# Patient Record
Sex: Female | Born: 1980 | Race: Black or African American | Hispanic: No | Marital: Single | Smoking: Former smoker
Health system: Southern US, Community
[De-identification: ages and names within clinical notes are randomized; demographics above are authoritative.]

## PROBLEM LIST (undated history)

## (undated) DIAGNOSIS — F419 Anxiety disorder, unspecified: Secondary | ICD-10-CM

## (undated) DIAGNOSIS — K219 Gastro-esophageal reflux disease without esophagitis: Secondary | ICD-10-CM

## (undated) DIAGNOSIS — R519 Headache, unspecified: Secondary | ICD-10-CM

## (undated) DIAGNOSIS — R51 Headache: Secondary | ICD-10-CM

## (undated) DIAGNOSIS — R490 Dysphonia: Secondary | ICD-10-CM

## (undated) HISTORY — PX: OTHER SURGICAL HISTORY: SHX169

---

## 2001-03-07 ENCOUNTER — Emergency Department (HOSPITAL_COMMUNITY): Admission: EM | Admit: 2001-03-07 | Discharge: 2001-03-07 | Payer: Self-pay | Admitting: Emergency Medicine

## 2001-03-07 ENCOUNTER — Encounter: Payer: Self-pay | Admitting: Emergency Medicine

## 2001-06-06 ENCOUNTER — Emergency Department (HOSPITAL_COMMUNITY): Admission: EM | Admit: 2001-06-06 | Discharge: 2001-06-06 | Payer: Self-pay

## 2001-12-26 ENCOUNTER — Emergency Department (HOSPITAL_COMMUNITY): Admission: EM | Admit: 2001-12-26 | Discharge: 2001-12-26 | Payer: Self-pay | Admitting: Emergency Medicine

## 2002-03-27 ENCOUNTER — Emergency Department (HOSPITAL_COMMUNITY): Admission: EM | Admit: 2002-03-27 | Discharge: 2002-03-27 | Payer: Self-pay | Admitting: Emergency Medicine

## 2002-03-27 ENCOUNTER — Encounter: Payer: Self-pay | Admitting: Emergency Medicine

## 2002-12-30 ENCOUNTER — Emergency Department (HOSPITAL_COMMUNITY): Admission: EM | Admit: 2002-12-30 | Discharge: 2002-12-31 | Payer: Self-pay | Admitting: Emergency Medicine

## 2003-07-02 ENCOUNTER — Emergency Department (HOSPITAL_COMMUNITY): Admission: EM | Admit: 2003-07-02 | Discharge: 2003-07-02 | Payer: Self-pay | Admitting: Emergency Medicine

## 2003-12-10 ENCOUNTER — Emergency Department (HOSPITAL_COMMUNITY): Admission: EM | Admit: 2003-12-10 | Discharge: 2003-12-10 | Payer: Self-pay | Admitting: Emergency Medicine

## 2004-09-09 ENCOUNTER — Emergency Department (HOSPITAL_COMMUNITY): Admission: EM | Admit: 2004-09-09 | Discharge: 2004-09-09 | Payer: Self-pay | Admitting: Emergency Medicine

## 2005-06-14 ENCOUNTER — Emergency Department (HOSPITAL_COMMUNITY): Admission: EM | Admit: 2005-06-14 | Discharge: 2005-06-14 | Payer: Self-pay | Admitting: Emergency Medicine

## 2006-04-16 ENCOUNTER — Ambulatory Visit (HOSPITAL_COMMUNITY): Admission: RE | Admit: 2006-04-16 | Discharge: 2006-04-16 | Payer: Self-pay | Admitting: Obstetrics & Gynecology

## 2006-09-15 ENCOUNTER — Inpatient Hospital Stay (HOSPITAL_COMMUNITY): Admission: AD | Admit: 2006-09-15 | Discharge: 2006-09-17 | Payer: Self-pay | Admitting: Obstetrics

## 2009-08-16 ENCOUNTER — Encounter: Admission: RE | Admit: 2009-08-16 | Discharge: 2009-08-16 | Payer: Self-pay | Admitting: Family Medicine

## 2010-12-12 LAB — CBC
Hemoglobin: 10.3 — ABNORMAL LOW
MCHC: 33.5
MCHC: 34
MCV: 97.8
Platelets: 235
RBC: 3.1 — ABNORMAL LOW

## 2010-12-12 LAB — RPR: RPR Ser Ql: NONREACTIVE

## 2010-12-12 LAB — CCBB MATERNAL DONOR DRAW

## 2011-01-05 DIAGNOSIS — G44009 Cluster headache syndrome, unspecified, not intractable: Secondary | ICD-10-CM | POA: Insufficient documentation

## 2011-01-10 DIAGNOSIS — E559 Vitamin D deficiency, unspecified: Secondary | ICD-10-CM | POA: Insufficient documentation

## 2012-05-07 DIAGNOSIS — Z87891 Personal history of nicotine dependence: Secondary | ICD-10-CM | POA: Insufficient documentation

## 2013-06-26 DIAGNOSIS — G43909 Migraine, unspecified, not intractable, without status migrainosus: Secondary | ICD-10-CM | POA: Insufficient documentation

## 2014-03-03 DIAGNOSIS — F411 Generalized anxiety disorder: Secondary | ICD-10-CM | POA: Insufficient documentation

## 2014-03-26 ENCOUNTER — Ambulatory Visit: Payer: 59 | Attending: Otolaryngology | Admitting: Speech Pathology

## 2014-03-26 DIAGNOSIS — R490 Dysphonia: Secondary | ICD-10-CM | POA: Diagnosis present

## 2014-03-26 NOTE — Patient Instructions (Addendum)
   Instead of coughing try:  Forceful blow followed by hard swallow  Quiet /MMM/ followed by hard swallow   ABDOMINAL BREATHING  . Place your hand on your belly . Breathe in through your nose and fill your belly with air, watching your hand move outward . Breathe out through your mouth and watch your belly move in. An audible "sh" may help   Think of your belly as a balloon, when you fill with air, the balloon gets bigger. As the air goes out, the balloon deflates.  If you are having difficulty coordinating this, lay on your back with a plastic cup on your belly and repeat the above steps, watching you belly move up with inhalation and down with exhalations  Practice breathing in and out in front of a mirror, watching your belly Breathe in for a count of 5 and breathe out for a count of 5  Now as you breathe out, get a picture of relaxing in your mind Feel the constant in-out of your breathing with your belly Picture the tension in your throat and chest evaporate like steam, melting away and FEEL it do so Picture your throat opening up so wide that a grapefruit or softball could fit through your throat.  There's an App for that: Breathe2relax  Provided by: Vernona RiegerLaura L. ST [phone]

## 2014-03-26 NOTE — Therapy (Signed)
[facility] [address] [place], [place], [address] Phone: [phone]   Fax:  [phone]  Speech Language Pathology Evaluation  Patient Details  Name: Terri Carter MRN: [id number] Date of Birth: [birth year] Referring Provider:  Serena Colonel, MD  Encounter Date: 03/26/2014      End of Session - 03/26/14 1411    SLP Start Time 1227   SLP Stop Time  1315   SLP Time Calculation (min) 48 min   Activity Tolerance Patient tolerated treatment well      No past medical history on file.  No past surgical history on file.  There were no vitals taken for this visit.  Visit Diagnosis: Muscle tension dysphonia - Plan: SLP plan of care cert/re-cert      Subjective Assessment - 03/26/14 1231    Currently in Pain? No/denies   Multiple Pain Sites No          SLP Evaluation [facility] - 03/26/14 1219    SLP Visit Information   SLP Received On 03/26/14   Onset Date February 04, 2014   Medical Diagnosis dysphonia   Subjective   Subjective "all of the sudden I couldn't talk"   Patient/Family Stated Goal Have a normal voice back   General Information   HPI 34 y.o. female who is referred by ENT due muscle tension dysphonia. Intermittent hoarseness began after visiting a family member who has hospitalized with pna. Endoscopic exam of larynx by ENT revealed normal vocal folds with post glottic edema suggestive of LPR. ENT recommend smoking cessation, eliminating caffine, chocolate, spicy and acidic foods. Pt taking omeprazole daily.    Mobility Status walks indpendently   Prior Functional Status   Cognitive/Linguistic Baseline Within functional limits   Type of Home House    Lives With Significant other;Daughter   Vocation On disability   Motor Speech   Overall Motor Speech Impaired   Respiration Impaired   Level of Impairment Conversation   Phonation Hoarse   Phonation Impaired   Vocal Abuses Smoking;Prolonged Vocal  Use;Vocal Fold Dehydration;Habitual Cough/Throat Clear   Tension Present Neck   Volume Appropriate           ADULT SLP TREATMENT - 03/26/14 0001    Cognitive-Linquistic Treatment   Skilled Treatment Pt instructed in voice conservation strategies. She required usual mod A for breathy, confidential voice production. Pt also trained in abdominal breathing in isolation which she achieved with usual min A. Initiated training for cough/throat clear alternative, pt required usual mod A to correctly produce this. Educated re: Interior and spatial designer and hygeine.           SLP Education - 03/26/14 1410    Education provided Yes   Education Details voice conservation/hygiene, abdominal breathing, throat clear alternatives, mechanical, dietary and pharmacologic tx for LPR          SLP Short Term Goals - 03/26/14 1325    SLP SHORT TERM GOAL #1   Title Pt will verbalize vocal conservation/hygeniene strategies wtih 80% accuracy and rare minimal asistance (04/26/14)   Time 4   Period Weeks   Status New   SLP SHORT TERM GOAL #2   Title Pt will report carryover of 4 voice conservation strategies over 3 sessions with modified independence.(04/26/14)   Time 4   Period Weeks   Status New   SLP SHORT TERM GOAL #3   Title Pt will demonsrate abdominal breathing during strucured speech tasks 85% of opportunities with occassional minimal assitance (04/26/14)  Time 4   Period Weeks   Status New          SLP Long Term Goals - 03/26/14 1327    SLP LONG TERM GOAL #1   Title Pt will utlize voice conservation strategies during a 15 minute complex conversation with supervision cues. (05/21/14)   Time 8   Period Weeks   Status New   SLP LONG TERM GOAL #2   Title Pt will utilize abdominal breathing during 15 minute conversation with rare minimal assistance. [phone](04/2414)   Time 8   Period Weeks   Status New   SLP LONG TERM GOAL #3   Title Pt will demonsrate optimal phonation during 15 minute conversation  (05/21/14)   Time 8   Period Weeks   Status New          Plan - 03/26/14 1312    Clinical Impression Statement Pt presents with severe dysphonia with intermittent aphonia. Sustained /a/ average is 2.82 seconds with 15 seconds being WFL. Pt. 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 Tension noted in throat and neck. Pt observed to cough/thorat clear 3x durinng this evaluation. She reports coughing regularly throughout her day. Pt is smoker, planing to quit. Overall intelligiblity is reduced to 75% at face to face conversation level. I reocmmend pt receive skilled ST for voice conservation and vocal hygiene training. Pt has been instructed to limit the use of her voice for only essential communication at this time. Pt to ask employer for duties that limit her talking at this time if available.    Speech Therapy Frequency 2x / week   Treatment/Interventions Compensatory strategies;Functional tasks;Cueing hierarchy;Patient/family education;SLP instruction and feedback;Internal/external aids   Potential to Achieve Goals Good   Consulted and Agree with Plan of Care Patient        Problem List There are no active problems to display for this patient.   Lovvorn, Radene JourneyLaura Ann, SLP 03/26/2014, 2:13 PM  [facility] [address] [place], [place], [address] Phone: [phone]   Fax:  [phone]

## 2014-03-31 ENCOUNTER — Ambulatory Visit: Payer: 59 | Attending: Otolaryngology | Admitting: Speech Pathology

## 2014-03-31 DIAGNOSIS — R490 Dysphonia: Secondary | ICD-10-CM | POA: Diagnosis present

## 2014-03-31 NOTE — Patient Instructions (Signed)
Lynnell GrainYawn-sigh, Continue practicing abdominal breathing

## 2014-03-31 NOTE — Therapy (Signed)
[facility] [address] [place], [place], [address] Phone: [phone]   Fax:  [phone]  Speech Language Pathology Treatment  Patient Details  Name: Terri Carter MRN: [id number] Date of Birth: [birth year] Referring Provider:  Serena Colonel, MD  Encounter Date: 03/31/2014      End of Session - 03/31/14 1016    Visit Number 2   Number of Visits 16   Date for SLP Re-Evaluation 05/21/14   SLP Start Time 0930   SLP Stop Time  1016   SLP Time Calculation (min) 46 min   Activity Tolerance Patient tolerated treatment well      No past medical history on file.  No past surgical history on file.  There were no vitals taken for this visit.  Visit Diagnosis: Muscle tension dysphonia      Subjective Assessment - 03/31/14 0939    Symptoms "I've been practicing my abdominal breathing"   Currently in Pain? Yes   Pain Score 2    Pain Location Throat   Pain Orientation Right   Pain Onset 1 to 4 weeks ago   Pain Frequency Constant   Aggravating Factors  voice   Multiple Pain Sites No             ADULT SLP TREATMENT - 03/31/14 0942    General Information   Behavior/Cognition Alert;Cooperative;Pleasant mood   Treatment Provided   Treatment provided Cognitive-Linquistic   Cognitive-Linquistic Treatment   Treatment focused on Voice   Skilled Treatment Pt doing abdominal breathing in isolation with occassional minimal assistance.  Abdominal breathing in simple conversation helps achieve phonation for 4-5 words before aphonia begins. Sustained /a/ with abdominal breathing averge 6 seconds with occassional min A. Improved from eval last week which was 2.82 seconds. Pt using confidential voice 95% of session with rare minimal assistance. Introduced pitch glides - pt not successful with max A - continued aphonic with pitch breaks. Yawn -sigh with phonation 50% with usual mod A.  Quiet phonation achieved with simple  conversation, and abdominal breathing 50% of utterances.   Assessment / Recommendations / Plan   Plan Continue with current plan of care   Progression Toward Goals   Progression toward goals Progressing toward goals          SLP Education - 03/31/14 1015    Education provided Yes   Education Details abdominal breathing, yawn sigh, confidential voice, limit talking   Person(s) Educated Patient   Methods Explanation;Demonstration   Comprehension Verbalized understanding;Returned demonstration          SLP Short Term Goals - 03/26/14 1325    SLP SHORT TERM GOAL #1   Title Pt will verbalize vocal conservation/hygeniene strategies wtih 80% accuracy and rare minimal asistance (04/26/14)   Time 4   Period Weeks   Status New   SLP SHORT TERM GOAL #2   Title Pt will report carryover of 4 voice conservation strategies over 3 sessions with modified independence.(04/26/14)   Time 4   Period Weeks   Status New   SLP SHORT TERM GOAL #3   Title Pt will demonsrate abdominal breathing during strucured speech tasks 85% of opportunities with occassional minimal assitance (04/26/14)   Time 4   Period Weeks   Status New          SLP Long Term Goals - 03/26/14 1327    SLP LONG TERM GOAL #1   Title Pt will utlize voice conservation strategies during a 15 minute complex  conversation with supervision cues. (05/21/14)   Time 8   Period Weeks   Status New   SLP LONG TERM GOAL #2   Title Pt will utilize abdominal breathing during 15 minute conversation with rare minimal assistance. [phone](04/2414)   Time 8   Period Weeks   Status New   SLP LONG TERM GOAL #3   Title Pt will demonsrate optimal phonation during 15 minute conversation (05/21/14)   Time 8   Period Weeks   Status New          Plan - 03/31/14 1016    Clinical Impression Statement Continue skilled ST to maximize optimal phonation and carryover of vocal conservation and hygiene strategies. Pt brought water bottle to session. Reports  drinking more water throughout the day.   Speech Therapy Frequency 2x / week   Treatment/Interventions Compensatory strategies;Functional tasks;Cueing hierarchy;Patient/family education;SLP instruction and feedback;Internal/external aids   Potential to Achieve Goals Good        Problem List There are no active problems to display for this patient.   Shaaron Golliday, Radene JourneyLaura Ann, SLP 03/31/2014, 10:17 AM  [facility] [address] [place], [place], [address] Phone: [phone]   Fax:  [phone]

## 2014-04-02 ENCOUNTER — Ambulatory Visit: Payer: 59 | Admitting: Speech Pathology

## 2014-04-02 DIAGNOSIS — R490 Dysphonia: Secondary | ICD-10-CM | POA: Diagnosis not present

## 2014-04-02 NOTE — Patient Instructions (Signed)
Pt. To continue yawn/sigh, gentle pitch glides, ha and vocal rest, conservation

## 2014-04-07 ENCOUNTER — Telehealth: Payer: Self-pay | Admitting: Speech Pathology

## 2014-04-07 ENCOUNTER — Ambulatory Visit: Payer: 59 | Admitting: Speech Pathology

## 2014-04-07 NOTE — Telephone Encounter (Signed)
Spoke with pt re: her missed 9:30 speech therapy appointment this morning. Pt reported her daughter is sick and home from school as reason for missed appointment. I reviewed date of her next appointment and instructed her to call and cancel any appointments she is not able to make.

## 2014-04-07 NOTE — Therapy (Signed)
[facility] [address] [place], [place], [address] Phone: [phone]   Fax:  [phone]  Speech Language Pathology Treatment  Patient Details  Name: Terri Carter MRN: [id number] Date of Birth: 1980-08-24 Referring Provider:  Serena Colonelosen, Jefry, MD  Encounter Date: 04/02/2014    No past medical history on file.  No past surgical history on file.  There were no vitals taken for this visit.  Visit Diagnosis: No diagnosis found.               SLP Short Term Goals - 03/26/14 1325    SLP SHORT TERM GOAL #1   Title Pt will verbalize vocal conservation/hygeniene strategies wtih 80% accuracy and rare minimal asistance (04/26/14)   Time 4   Period Weeks   Status New   SLP SHORT TERM GOAL #2   Title Pt will report carryover of 4 voice conservation strategies over 3 sessions with modified independence.(04/26/14)   Time 4   Period Weeks   Status New   SLP SHORT TERM GOAL #3   Title Pt will demonsrate abdominal breathing during strucured speech tasks 85% of opportunities with occassional minimal assitance (04/26/14)   Time 4   Period Weeks   Status New          SLP Long Term Goals - 03/26/14 1327    SLP LONG TERM GOAL #1   Title Pt will utlize voice conservation strategies during a 15 minute complex conversation with supervision cues. (05/21/14)   Time 8   Period Weeks   Status New   SLP LONG TERM GOAL #2   Title Pt will utilize abdominal breathing during 15 minute conversation with rare minimal assistance. [phone](04/2414)   Time 8   Period Weeks   Status New   SLP LONG TERM GOAL #3   Title Pt will demonsrate optimal phonation during 15 minute conversation (05/21/14)   Time 8   Period Weeks   Status New          Problem List There are no active problems to display for this patient.   Vonnetta Akey, Radene JourneyLaura Ann,, SLP 04/07/2014, 8:41 AM  [facility] [address] [place], [place], [address] Phone: [phone]   Fax:  [phone]

## 2014-04-09 ENCOUNTER — Ambulatory Visit: Payer: 59 | Admitting: Speech Pathology

## 2014-04-14 ENCOUNTER — Ambulatory Visit: Payer: 59 | Admitting: Speech Pathology

## 2014-04-14 DIAGNOSIS — R490 Dysphonia: Secondary | ICD-10-CM | POA: Diagnosis not present

## 2014-04-14 NOTE — Therapy (Signed)
[facility] [address] [place], [place], [address] Phone: [phone]   Fax:  [phone]  Speech Language Pathology Treatment  Patient Details  Name: Oval LinseyLatonya M Lobello MRN: [id number] Date of Birth: 04-15-1980 Referring Provider:  Serena Colonelosen, Jefry, MD  Encounter Date: 04/14/2014      End of Session - 04/14/14 1014    SLP Stop Time  1014   Activity Tolerance Patient tolerated treatment well      No past medical history on file.  No past surgical history on file.  There were no vitals taken for this visit.  Visit Diagnosis: Muscle tension dysphonia      Subjective Assessment - 04/14/14 0938    Symptoms "I still have a headache - I haven't slept in 2 days"    Currently in Pain? Yes   Pain Score 9    Pain Location Head   Pain Orientation Right;Left   Pain Descriptors / Indicators Aching   Pain Type Chronic pain   Pain Onset More than a month ago  h/o cluster h/a   Pain Frequency Constant             ADULT SLP TREATMENT - 04/14/14 0944    General Information   Behavior/Cognition Alert;Cooperative;Pleasant mood   Treatment Provided   Treatment provided Cognitive-Linquistic   Cognitive-Linquistic Treatment   Treatment focused on Voice   Skilled Treatment Abdominal breathing with sustained /a/ for 16.75 with clear, quiet phonation.  abdominal breathing in conversation 80% of  conversation . Pitch glides resluted in pitch breaks at high pitch 1/4  glides. Much improved.   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  Conversation with confidentlial voice achieved clear phonation 80% of ocnversation. When pt increased volume, phonation became more hoarse.     Assessment / Recommendations / Plan   Plan Continue with current plan of care   Progression Toward Goals   Progression toward goals Progressing toward goals          SLP Education - 04/14/14 1009    Education provided Yes   Education Details vocal conservation   Person(s) Educated Patient   Methods Explanation;Demonstration   Comprehension Verbalized understanding;Returned demonstration          SLP Short Term Goals - 04/14/14 1012    SLP SHORT TERM GOAL #1   Title Pt will verbalize vocal conservation/hygeniene strategies wtih 80% accuracy and rare minimal asistance (04/26/14)   Time 2   Period Weeks   Status On-going   SLP SHORT TERM GOAL #2   Title Pt will report carryover of 4 voice conservation strategies over 3 sessions with modified independence.(04/26/14)   Time 2   Status On-going   SLP SHORT TERM GOAL #3   Title Pt will demonsrate abdominal breathing during strucured speech tasks 85% of opportunities with occassional minimal assitance (04/26/14)   Time 2   Period Weeks   Status On-going          SLP Long Term Goals - 04/14/14 1013    SLP LONG TERM GOAL #1   Title Pt will utlize voice conservation strategies during a 15 minute complex conversation with supervision cues. (05/21/14)   Time 6   Period Weeks   Status On-going   SLP LONG TERM GOAL #2   Title Pt will utilize abdominal breathing during 15 minute conversation with rare minimal assistance. (04/2414)   Time 6   Status On-going   SLP LONG TERM GOAL #3  Title Pt will demonsrate optimal phonation during 15 minute conversation (05/21/14)   Time 6   Period Weeks   Status On-going          Plan - 04/14/14 1010    Clinical Impression Statement Voice improved today when using confidential voice and sustained /a/. Voice continues to  become hoarse with increased volume.   Speech Therapy Frequency 2x / week   Treatment/Interventions Compensatory strategies;Functional tasks;Cueing hierarchy;Patient/family education;SLP instruction and feedback;Internal/external aids   Potential to Achieve Goals Good   Consulted and Agree with Plan of Care Patient        Problem List There are no active problems to  display for this patient.   Lovvorn, Radene Journey, SLP 04/14/2014, 10:15 AM  [facility] [address] [place], [place], [address] Phone: [phone]   Fax:  [phone]

## 2014-04-14 NOTE — Patient Instructions (Signed)
This week continue focus on breathing, continue confidential voice Practicing breathing and vowels with increased volume

## 2014-04-16 ENCOUNTER — Ambulatory Visit: Payer: 59 | Admitting: Speech Pathology

## 2014-04-16 DIAGNOSIS — R490 Dysphonia: Secondary | ICD-10-CM

## 2014-04-16 NOTE — Patient Instructions (Signed)
  Deep Breathe before each one  Repeat 3-4x slightly increased volume  [place]  [place]  [place], [place], [address], [address], [place], [place], [place], T[place], Tee  F, [place],Th with the above vowels  Can mix sounds if you want   Avoid practicing with k,g Can mix them up

## 2014-04-16 NOTE — Therapy (Signed)
[facility] [address] [place], [place], [address] Phone: [phone]   Fax:  [phone]  Speech Language Pathology Treatment  Patient Details  Name: Terri Carter MRN: [id number] Date of Birth: 12/12/1980 Referring Provider:  Serena Colonel, MD  Encounter Date: 04/16/2014      End of Session - 04/16/14 1153    Visit Number 5   Number of Visits 16   Date for SLP Re-Evaluation 05/21/14   SLP Start Time 1115   SLP Stop Time  1153   SLP Time Calculation (min) 38 min      No past medical history on file.  No past surgical history on file.  There were no vitals taken for this visit.  Visit Diagnosis: Muscle tension dysphonia      Subjective Assessment - 04/16/14 1117    Symptoms Pt. 15 min late to therapy - "I feel much better - my headache is much better"             ADULT SLP TREATMENT - 04/16/14 1119    General Information   Behavior/Cognition Alert;Cooperative;Pleasant mood   Treatment Provided   Treatment provided Cognitive-Linquistic   Pain Assessment   Pain Assessment 0-10   Pain Score 2    Pain Descriptors / Indicators Aching   Pain Intervention(s) Monitored during session   Cognitive-Linquistic Treatment   Treatment focused on Voice   Skilled Treatment Sustained /a/, repetitive vowels and consonant/vowel syllables with abdmonimal breathing and gentle onset voice. She was able to increase volume slightly on these and maintain clear phonation with min to mod A.  Pt utilizing throat clear alternative with rare min A.  Oral reading 5-7 word sentences with confidential voice, abdominal breathing with clear phonation. When instructed to increase volume hoarseness/strained voice returned.      Assessment / Recommendations / Plan   Plan Continue with current plan of care   Progression Toward Goals   Progression toward goals Progressing toward goals          SLP Education - 04/16/14 1152     Education Details added to voice homework   Person(s) Educated Patient   Methods Explanation;Demonstration   Comprehension Verbalized understanding;Returned demonstration          SLP Short Term Goals - 04/16/14 1158    SLP SHORT TERM GOAL #1   Title Pt will verbalize vocal conservation/hygeniene strategies wtih 80% accuracy and rare minimal asistance (04/26/14)   Time 2   Period Weeks   Status Achieved   SLP SHORT TERM GOAL #2   Title Pt will report carryover of 4 voice conservation strategies over 3 sessions with modified independence.(04/26/14)   Time 2   Status Achieved   SLP SHORT TERM GOAL #3   Title Pt will demonsrate abdominal breathing during strucured speech tasks 85% of opportunities with occassional minimal assitance (04/26/14)   Time 2   Period Weeks   Status Achieved          SLP Long Term Goals - 04/16/14 1158    SLP LONG TERM GOAL #1   Title Pt will utlize voice conservation strategies during a 15 minute complex conversation with supervision cues. (05/21/14)   Time 6   Period Weeks   Status On-going   SLP LONG TERM GOAL #2   Title Pt will utilize abdominal breathing during 15 minute conversation with rare minimal assistance. (04/2414)   Time 6   Status On-going   SLP LONG TERM GOAL #3  Title Pt will demonsrate optimal phonation during 15 minute conversation (05/21/14)   Time 6   Period Weeks   Status On-going          Plan - 04/16/14 1155    Clinical Impression Statement Voice continues to be hoarse and strained with increased volume. Pt using conifidiential voice,  abdominal breathing and easy onset phonation at conversation level with occassional min A. Pt successfully increased volume with clear phonation with cv syllable repetition with usual mod A. Continue skilled ST tomaximize optimal phonation.   Speech Therapy Frequency 2x / week   Treatment/Interventions Compensatory strategies;Functional tasks;Cueing hierarchy;Patient/family education;SLP  instruction and feedback;Internal/external aids   Potential to Achieve Goals Good   Consulted and Agree with Plan of Care Patient        Problem List There are no active problems to display for this patient.   Lovvorn, Radene JourneyLaura Ann, SLP 04/16/2014, 11:59 AM  [facility] [address] [place], [place], [address] Phone: [phone]   Fax:  [phone]

## 2014-04-21 ENCOUNTER — Ambulatory Visit: Payer: 59

## 2014-04-21 DIAGNOSIS — R490 Dysphonia: Secondary | ICD-10-CM

## 2014-04-21 NOTE — Therapy (Signed)
[facility] [address] [place], [place], [address] Phone: [phone]   Fax:  [phone]  Speech Language Pathology Treatment  Patient Details  Name: Terri Carter MRN: [id number] Date of Birth: Jul 15, 1980 Referring Provider:  Serena Colonelosen, Jefry, MD  Encounter Date: 04/21/2014      End of Session - 04/21/14 1214    Visit Number 6   Number of Visits 16   Date for SLP Re-Evaluation 05/21/14   SLP Start Time 1022   SLP Stop Time  1101   SLP Time Calculation (min) 39 min   Activity Tolerance Patient limited by pain  pt c/o headache 8/10 and rt throat pain (under mandible) 2-3/10      No past medical history on file.  No past surgical history on file.  There were no vitals taken for this visit.  Visit Diagnosis: Muscle tension dysphonia      Subjective Assessment - 04/21/14 1205    Symptoms Approx 7 minutes late to tx today. Pt reports tightness in throat area.             ADULT SLP TREATMENT - 04/21/14 1029    General Information   Behavior/Cognition Alert;Cooperative;Pleasant mood   Treatment Provided   Treatment provided Cognitive-Linquistic   Pain Assessment   Pain Assessment 0-10   Pain Score 8    Pain Location H/A   Pain Descriptors / Indicators Aching   Pain Intervention(s) Monitored during session   Cognitive-Linquistic Treatment   Treatment focused on Voice   Skilled Treatment Sustained /u/ at static pitch with good to excellent success with WNL confidential voice. SLP assessed pt's confidential voice in 12 minutes conversation as WNL. She used abdominal breathing 90% of the time - pt remarked she had been practicing proper breathing. Pitch glides high to low were successful wiht confidential voice 50% of the time; in low-high-low pitch glides, pt was 25% successful. SLP facilitated progressive relaxation with pt in attempt to foster WNLvoicing in confidential voice with humming - strained  voice predominantly heard. Pt with consistent belching today following instances of 1-2 sips of cool water.    Assessment / Recommendations / Plan   Plan Continue with current plan of care   Progression Toward Goals   Progression toward goals --  limited success with confidential voice via multiple means          SLP Education - 04/21/14 1213    Education provided Yes   Education Details visualization/relaxation   Person(s) Educated Patient   Methods Explanation;Demonstration   Comprehension Verbalized understanding          SLP Short Term Goals - 04/21/14 1216    SLP SHORT TERM GOAL #1   Title Pt will verbalize vocal conservation/hygeniene strategies wtih 80% accuracy and rare minimal asistance (04/26/14)   Time 2   Period Weeks   Status Achieved   SLP SHORT TERM GOAL #2   Title Pt will report carryover of 4 voice conservation strategies over 3 sessions with modified independence.(04/26/14)   Time 2   Status Achieved   SLP SHORT TERM GOAL #3   Title Pt will demonsrate abdominal breathing during strucured speech tasks 85% of opportunities with occassional minimal assitance (04/26/14)   Time 2   Period Weeks   Status Achieved          SLP Long Term Goals - 04/21/14 1217    SLP LONG TERM GOAL #1   Title Pt will utlize voice conservation strategies during  a 15 minute complex conversation with supervision cues. (05/21/14)   Time 5   Period Weeks   Status On-going   SLP LONG TERM GOAL #2   Title Pt will utilize abdominal breathing during 15 minute conversation with rare minimal assistance. (04/2414)   Time 5   Status On-going   SLP LONG TERM GOAL #3   Title Pt will demonsrate optimal phonation during 15 minute conversation (05/21/14)   Time 5   Period Weeks   Status On-going          Plan - 04/21/14 1215    Clinical Impression Statement Pt with limited progress today with confidential voice in every task except in conversation. Cont skilled ST recommended to  maximze functional voice across speaking situations. Assess pt's goals in 1-2 weeks.   Speech Therapy Frequency 2x / week   Duration 2 weeks   Treatment/Interventions Compensatory strategies;Functional tasks;Cueing hierarchy;Patient/family education;SLP instruction and feedback;Internal/external aids   Potential to Achieve Goals Good   Potential Considerations Severity of impairments        Problem List There are no active problems to display for this patient.   [place], SLP 04/21/2014, 12:17 PM  [facility] [address] [place], [place], [address] Phone: [phone]   Fax:  [phone]

## 2014-04-21 NOTE — Patient Instructions (Signed)
Focus your mind on your breathing and visualize clouds/taking pictures and relax the muscles in your body Scan muscles in your throat and picture these muscles relaxing You may want to try some moist heat and/or easy gentle massage of your muscles in your throat.

## 2014-04-23 ENCOUNTER — Ambulatory Visit: Payer: 59 | Admitting: Speech Pathology

## 2014-04-23 DIAGNOSIS — R490 Dysphonia: Secondary | ICD-10-CM | POA: Diagnosis not present

## 2014-04-23 NOTE — Patient Instructions (Signed)
Continue gentle speech exercises with vowels, easy onset, gradually increasing volume as tolerated  Continue relaxation and reduce neck shoulder tension

## 2014-04-23 NOTE — Therapy (Signed)
[facility] [address] [place], [place], [address] Phone: [phone]   Fax:  [phone]  Speech Language Pathology Treatment  Patient Details  Name: Terri Carter MRN: [id number] Date of Birth: May 29, 1980 Referring Provider:  Serena Colonel, MD  Encounter Date: 04/23/2014      End of Session - 04/23/14 1311    Visit Number 7   Number of Visits 16   Date for SLP Re-Evaluation 05/21/14   SLP Start Time 1231   SLP Stop Time  1311   SLP Time Calculation (min) 40 min      No past medical history on file.  No past surgical history on file.  There were no vitals taken for this visit.  Visit Diagnosis: No diagnosis found.      Subjective Assessment - 04/23/14 1232    Symptoms "I just don't feel well"             ADULT SLP TREATMENT - 04/23/14 1233    General Information   Behavior/Cognition Alert;Cooperative;Pleasant mood   Pain Assessment   Pain Assessment 0-10   Pain Score 8    Pain Location head/neck on right   Pain Descriptors / Indicators Aching   Pain Intervention(s) Monitored during session   Cognitive-Linquistic Treatment   Treatment focused on Voice   Skilled Treatment Confidential voice used independently, pt tearful during therapy due to pain and "stress." Confidential sustained vowels and  easy onset cv syllables with clear phonation at confidential voice level, upon increased volume dysphonia returns. Pt with hydrophonia today, despite cues for hard swallows and throat clear (gentle) to clear voice. Pt c/o  pill lodging in her esophagus and food sticking in her sternal area. Pt did not  achieve clear phonation with  increased volume.    Assessment / Recommendations / Plan   Plan Continue with current plan of care   Progression Toward Goals   Progression toward goals Progressing toward goals          SLP Education - 04/23/14 1310    Education provided Yes   Education Details  home exercises for gentle voice   Person(s) Educated Patient   Methods Explanation;Demonstration   Comprehension Verbalized understanding          SLP Short Term Goals - 04/23/14 1312    SLP SHORT TERM GOAL #1   Title Pt will verbalize vocal conservation/hygeniene strategies wtih 80% accuracy and rare minimal asistance (04/26/14)   Time 2   Period Weeks   Status Achieved   SLP SHORT TERM GOAL #2   Title Pt will report carryover of 4 voice conservation strategies over 3 sessions with modified independence.(04/26/14)   Time 2   Status Achieved   SLP SHORT TERM GOAL #3   Title Pt will demonsrate abdominal breathing during strucured speech tasks 85% of opportunities with occassional minimal assitance (04/26/14)   Time 2   Period Weeks   Status Achieved          SLP Long Term Goals - 04/23/14 1312    SLP LONG TERM GOAL #1   Title Pt will utlize voice conservation strategies during a 15 minute complex conversation with supervision cues. (05/21/14)   Time 5   Period Weeks   Status On-going   SLP LONG TERM GOAL #2   Title Pt will utilize abdominal breathing during 15 minute conversation with rare minimal assistance. (04/2414)   Time 5   Status On-going   SLP LONG TERM GOAL #3  Title Pt will demonsrate optimal phonation during 15 minute conversation (05/21/14)   Time 5   Period Weeks   Status On-going          Plan - 04/23/14 1311    Clinical Impression Statement Pt with limited progress today with confidential voice in every task except in conversation. Cont skilled ST recommended to maximze functional voice across speaking situations. Assess pt's goals in 1-2 weeks.   Speech Therapy Frequency 2x / week   Duration 2 weeks   Treatment/Interventions Compensatory strategies;Functional tasks;Cueing hierarchy;Patient/family education;SLP instruction and feedback;Internal/external aids   Potential to Achieve Goals Good   Potential Considerations Severity of impairments    Consulted and Agree with Plan of Care Patient        Problem List There are no active problems to display for this patient.   Adonias Demore, Radene JourneyLaura Ann, SLP 04/23/2014, 1:14 PM  [facility] [address] [place], [place], [address] Phone: [phone]   Fax:  [phone]

## 2014-04-28 ENCOUNTER — Ambulatory Visit: Payer: 59 | Attending: Otolaryngology | Admitting: Speech Pathology

## 2014-04-28 DIAGNOSIS — R490 Dysphonia: Secondary | ICD-10-CM | POA: Diagnosis present

## 2014-04-28 NOTE — Therapy (Signed)
[facility] [address] [place], [place], [address] Phone: [phone]   Fax:  [phone]  Speech Language Pathology Treatment  Patient Details  Name: Terri Carter MRN: [id number] Date of Birth: [birth year] Referring Provider:  Izora Gala, MD  Encounter Date: 04/28/2014      End of Session - 04/28/14 1140    Visit Number 8   Number of Visits 16   Date for SLP Re-Evaluation 05/21/14   SLP Start Time 1112   Activity Tolerance Patient tolerated treatment well      No past medical history on file.  No past surgical history on file.  There were no vitals taken for this visit.  Visit Diagnosis: Muscle tension dysphonia      Subjective Assessment - 04/28/14 1115    Symptoms "I can talk today b/c I've been able to be outside"             ADULT SLP TREATMENT - 04/28/14 1116    General Information   Behavior/Cognition Alert;Cooperative;Pleasant mood   Treatment Provided   Treatment provided Cognitive-Linquistic   Pain Assessment   Pain Assessment 0-10   Pain Score 2    Pain Location right neck   Pain Descriptors / Indicators Aching   Pain Intervention(s) Premedicated before session   Cognitive-Linquistic Treatment   Treatment focused on Voice   Skilled Treatment Pt came in with audible phonation, she reports it helps her voice when she is able to stay cool.  She continues to carryover vocal conservation, throat clear alternative, easy onset phonation, confidential voice outside of therapy. Pitch glides with abdmonimal breathing 80% successful today . Pt is using breathe2relax app outside of therapy. Pt utilized abdmonimal breathing at conversation level with rare minimal assistance. Phonation audible with only mild raspiness. Pt maintained audible, relatively clear phonation over 30 minute conversation.      Assessment / Recommendations / Plan   Plan Continue with current plan of care   Progression  Toward Goals   Progression toward goals Progressing toward goals          SLP Education - 04/28/14 1139    Education provided Yes   Education Details abdominal breathing with "normal voice" and confidential voice   Person(s) Educated Patient   Methods Explanation;Demonstration   Comprehension Verbalized understanding          SLP Short Term Goals - 04/28/14 1152    SLP SHORT TERM GOAL #1   Title Pt will verbalize vocal conservation/hygeniene strategies wtih 80% accuracy and rare minimal asistance (04/26/14)   Time 2   Period Weeks   Status Achieved   SLP SHORT TERM GOAL #2   Title Pt will report carryover of 4 voice conservation strategies over 3 sessions with modified independence.(04/26/14)   Time 2   Status Achieved   SLP SHORT TERM GOAL #3   Title Pt will demonsrate abdominal breathing during strucured speech tasks 85% of opportunities with occassional minimal assitance (04/26/14)   Time 2   Period Weeks   Status Achieved          SLP Long Term Goals - 04/28/14 1152    SLP LONG TERM GOAL #1   Title Pt will utlize voice conservation strategies during a 15 minute complex conversation with supervision cues. (05/21/14)   Time 5   Period Weeks   Status On-going   SLP LONG TERM GOAL #2   Title Pt will utilize abdominal breathing during 15 minute conversation with rare minimal  assistance. (04/2414)   Time 5   Status Achieved   SLP LONG TERM GOAL #3   Title Pt will demonsrate optimal phonation during 15 minute conversation (05/21/14)   Time 5   Period Weeks   Status Partially Met          Plan - 04/28/14 1141    Clinical Impression Statement Pt achieved audible phonation with mild raspiness, however strained, strangled voice has resolved as of yesterday per pt. I instructed pt to monitor voice quatlity and if if voice remains audible , we will consider reducing frequency to 1x a week for 1-2 more weeks.    Speech Therapy Frequency 2x / week   Treatment/Interventions  Compensatory strategies;Functional tasks;Cueing hierarchy;Patient/family education;SLP instruction and feedback;Internal/external aids   Potential to Achieve Goals Good   Consulted and Agree with Plan of Care Patient        Problem List There are no active problems to display for this patient.   [place], Annye Rusk, SLP 04/28/2014, 11:54 AM  [facility] [address] [place], [place], [address] Phone: [phone]   Fax:  [phone]

## 2014-04-28 NOTE — Patient Instructions (Signed)
  Continue to use abdominal breathing with audible "normal" phonation as well as with confidential voice.

## 2014-04-30 ENCOUNTER — Ambulatory Visit: Payer: 59 | Admitting: Speech Pathology

## 2014-05-05 ENCOUNTER — Ambulatory Visit: Payer: 59 | Admitting: Speech Pathology

## 2014-05-05 DIAGNOSIS — R490 Dysphonia: Secondary | ICD-10-CM

## 2014-05-05 NOTE — Therapy (Signed)
[facility] [address] [place], [place], [address] Phone: [phone]   Fax:  [phone]  Speech Language Pathology Treatment  Patient Details  Name: Terri Carter MRN: [id number] Date of Birth: March 05, 1980 Referring Provider:  Izora Gala, MD  Encounter Date: 05/05/2014      End of Session - 05/05/14 1015    SLP Stop Time  1015      No past medical history on file.  No past surgical history on file.  There were no vitals taken for this visit.  Visit Diagnosis: Muscle tension dysphonia      Subjective Assessment - 05/05/14 0944    Symptoms "My voice is gone - I had it 1 week"             ADULT SLP TREATMENT - 05/05/14 0946    General Information   Behavior/Cognition Alert;Cooperative;Pleasant mood   Treatment Provided   Treatment provided Cognitive-Linquistic   Pain Assessment   Pain Assessment No/denies pain   Cognitive-Linquistic Treatment   Treatment focused on Voice   Skilled Treatment Pt reported she had her voice for 1 week but lost it Monday as she said she raised her voice over the phone with her doctor's office. Voice is clear with reduced volume. Abdominal breathing is longer and deeper, allowing pt to  achieve phonation for longer periods. After practicing abdominal breatihng with mod I, pt increased volume with sustained vowels and oral reading 10 word seneteces.  At conversation level, pt achieved clear phonation with audible volume for 20+ words. Pt using abdominal breathing with mod I throughout session. Pt states she "feels reflux coming up" then looses her voice - this occurred 1x during this session.     Assessment / Recommendations / Plan   Plan Continue with current plan of care   Progression Toward Goals   Progression toward goals Progressing toward goals          SLP Education - 05/05/14 1008    Education provided Yes   Education Details continue LPR diet precautions,     Person(s) Educated Patient   Methods Explanation;Demonstration   Comprehension Verbalized understanding          SLP Short Term Goals - 05/05/14 1011    SLP SHORT TERM GOAL #1   Title Pt will verbalize vocal conservation/hygeniene strategies wtih 80% accuracy and rare minimal asistance (04/26/14)   Time 2   Period Weeks   Status Achieved   SLP SHORT TERM GOAL #2   Title Pt will report carryover of 4 voice conservation strategies over 3 sessions with modified independence.(04/26/14)   Time 2   Status Achieved   SLP SHORT TERM GOAL #3   Title Pt will demonsrate abdominal breathing during strucured speech tasks 85% of opportunities with occassional minimal assitance (04/26/14)   Time 2   Period Weeks   Status Achieved          SLP Long Term Goals - 05/05/14 1011    SLP LONG TERM GOAL #1   Title Pt will utlize voice conservation strategies during a 15 minute complex conversation with supervision cues. (05/21/14)   Time 5   Period Weeks   Status On-going   SLP LONG TERM GOAL #2   Title Pt will utilize abdominal breathing during 15 minute conversation with rare minimal assistance. (04/2414)   Time 5   Status Achieved   SLP LONG TERM GOAL #3   Title Pt will demonsrate optimal phonation during 15 minute  conversation (05/21/14)   Time 5   Period Weeks   Status Partially Met          Plan - 05/05/14 1009    Clinical Impression Statement Pt using abdmonimal breathing, voice conservation, LPR tx - with mod I. voice improved for 1 week at this time. recommend reduce ST to 1x a week for carry over of compensations for for dysphonia.    Speech Therapy Frequency 2x / week   Duration 2 weeks   Treatment/Interventions Compensatory strategies;Functional tasks;Cueing hierarchy;Patient/family education;SLP instruction and feedback;Internal/external aids   Potential to Achieve Goals Good        Problem List There are no active problems to display for this patient.   Moo Gravley,  Annye Rusk, SLP 05/05/2014, 10:15 AM  [facility] [address] [place], [place], [address] Phone: [phone]   Fax:  [phone]

## 2014-05-05 NOTE — Patient Instructions (Signed)
Continue abdominal breathing, pitch glides, voice conservation

## 2014-05-07 ENCOUNTER — Ambulatory Visit: Payer: 59

## 2014-05-12 ENCOUNTER — Ambulatory Visit: Payer: 59 | Admitting: Speech Pathology

## 2014-05-14 ENCOUNTER — Encounter: Payer: 59 | Admitting: Speech Pathology

## 2014-05-19 ENCOUNTER — Ambulatory Visit: Payer: 59 | Admitting: Speech Pathology

## 2014-05-21 ENCOUNTER — Encounter: Payer: 59 | Admitting: Speech Pathology

## 2014-05-26 ENCOUNTER — Encounter: Payer: Self-pay | Admitting: Speech Pathology

## 2014-05-26 NOTE — Therapy (Unsigned)
[facility] [address] [place], [place], [address] Phone: [phone]   Fax:  [phone]  Patient Details  Name: MINAL STULLER MRN: [id number] Date of Birth: 1980-05-29 Referring Provider:  Dr. Avel Peace  Encounter Date: 05/26/2014  SPEECH THERAPY DISCHARGE SUMMARY  Visits from Start of Care: 9  Current functional level related to goals / functional outcomes:   SLP LONG TERM GOAL #1     Title  Pt will utlize voice conservation strategies during a 15 minute complex conversation with supervision cues. (05/21/14)    Time  5    Period  Weeks    Status  Achieved    SLP LONG TERM GOAL #2    Title  Pt will utilize abdominal breathing during 15 minute conversation with rare minimal assistance. (04/2414)    Time  5    Status  Achieved    SLP LONG TERM GOAL #3    Title  Pt will demonsrate optimal phonation during 15 minute conversation (05/21/14)    Time  5    Period  Weeks    Status  Achieved            Remaining deficits: Pt reports her voice has improved over 2 weeks. Continue pharmacotheraputic and mechanical treatment of reflux. Continue voice conservation, vocal rest and abdominal breathing/relaxation as needed   Education / Equipment: Voice conservation, relaxation, breathing for speech  Plan: Patient agrees to discharge.  Patient goals were met. Patient is being discharged due to meeting the stated rehab goals.  ?????        Siddiq Kaluzny, Annye Rusk , SLP  05/26/2014, 2:05 PM  [facility] [address] [place], [place], [address] Phone: [phone]   Fax:  [phone]

## 2014-06-18 ENCOUNTER — Encounter (HOSPITAL_COMMUNITY): Payer: Self-pay | Admitting: *Deleted

## 2014-06-22 ENCOUNTER — Other Ambulatory Visit: Payer: Self-pay | Admitting: Gastroenterology

## 2014-06-23 ENCOUNTER — Ambulatory Visit (HOSPITAL_COMMUNITY)
Admission: RE | Admit: 2014-06-23 | Discharge: 2014-06-23 | Disposition: A | Discharge status: Home / Self Care | Payer: 59 | Source: Ambulatory Visit | Attending: Gastroenterology | Admitting: Gastroenterology

## 2014-06-23 ENCOUNTER — Ambulatory Visit (HOSPITAL_COMMUNITY): Payer: 59 | Admitting: Anesthesiology

## 2014-06-23 ENCOUNTER — Encounter (HOSPITAL_COMMUNITY)
Admission: RE | Disposition: A | Discharge status: Home / Self Care | Payer: Self-pay | Source: Ambulatory Visit | Attending: Gastroenterology

## 2014-06-23 ENCOUNTER — Encounter (HOSPITAL_COMMUNITY): Payer: Self-pay | Admitting: *Deleted

## 2014-06-23 DIAGNOSIS — K219 Gastro-esophageal reflux disease without esophagitis: Secondary | ICD-10-CM | POA: Insufficient documentation

## 2014-06-23 DIAGNOSIS — K2960 Other gastritis without bleeding: Secondary | ICD-10-CM | POA: Diagnosis not present

## 2014-06-23 DIAGNOSIS — F17200 Nicotine dependence, unspecified, uncomplicated: Secondary | ICD-10-CM | POA: Diagnosis not present

## 2014-06-23 DIAGNOSIS — R490 Dysphonia: Secondary | ICD-10-CM | POA: Diagnosis present

## 2014-06-23 DIAGNOSIS — R142 Eructation: Secondary | ICD-10-CM | POA: Diagnosis present

## 2014-06-23 HISTORY — DX: Headache: R51

## 2014-06-23 HISTORY — DX: Headache, unspecified: R51.9

## 2014-06-23 HISTORY — DX: Gastro-esophageal reflux disease without esophagitis: K21.9

## 2014-06-23 HISTORY — PX: BRAVO PH STUDY: SHX5421

## 2014-06-23 HISTORY — DX: Anxiety disorder, unspecified: F41.9

## 2014-06-23 HISTORY — PX: ESOPHAGOGASTRODUODENOSCOPY (EGD) WITH PROPOFOL: SHX5813

## 2014-06-23 HISTORY — DX: Dysphonia: R49.0

## 2014-06-23 SURGERY — ESOPHAGOGASTRODUODENOSCOPY (EGD) WITH PROPOFOL
Anesthesia: Monitor Anesthesia Care

## 2014-06-23 MED ORDER — BUTAMBEN-TETRACAINE-BENZOCAINE 2-2-14 % EX AERO: INHALATION_SPRAY | CUTANEOUS | Status: DC | PRN

## 2014-06-23 MED ORDER — SODIUM CHLORIDE 0.9 % IV SOLN: INTRAVENOUS | Status: DC

## 2014-06-23 MED ORDER — LACTATED RINGERS IV SOLN
INTRAVENOUS | Status: DC
  Administered 2014-06-23: 15:00:00 via INTRAVENOUS

## 2014-06-23 MED ORDER — LIDOCAINE HCL (CARDIAC) 20 MG/ML IV SOLN
INTRAVENOUS | Status: DC | PRN
  Administered 2014-06-23: 50 mg via INTRAVENOUS

## 2014-06-23 MED ORDER — LIDOCAINE HCL (CARDIAC) 20 MG/ML IV SOLN
INTRAVENOUS | Status: AC
  Filled 2014-06-23: qty 5

## 2014-06-23 MED ORDER — PROPOFOL 10 MG/ML IV BOLUS
INTRAVENOUS | Status: DC | PRN
  Administered 2014-06-23: 80 mg via INTRAVENOUS
  Administered 2014-06-23: 50 mg via INTRAVENOUS

## 2014-06-23 MED ORDER — PROPOFOL INFUSION 10 MG/ML OPTIME
INTRAVENOUS | Status: DC | PRN
  Administered 2014-06-23: 120 ug/kg/min via INTRAVENOUS

## 2014-06-23 SURGICAL SUPPLY — 14 items

## 2014-06-23 NOTE — Addendum Note (Signed)
Addended by: Savannha Welle on: 06/23/2014 10:50 AM   Modules accepted: Orders  

## 2014-06-23 NOTE — Op Note (Signed)
[facility] [address]  [place], [address]   ENDOSCOPY PROCEDURE REPORT  PATIENT: Terri Carter, Terri Carter  MR#: [id number] BIRTHDATE: May 14, 1980 , 33  yrs. old GENDER: female ENDOSCOPIST: Charlott RakesVincent Macon Lesesne, MD REFERRED BY: PROCEDURE DATE:  06/23/2014 PROCEDURE:  EGD w/ Bravo capsule placement ASA CLASS:     Class II INDICATIONS:  hoarseness; GERD; belching. MEDICATIONS: Monitored anesthesia care and Per Anesthesia TOPICAL ANESTHETIC:  DESCRIPTION OF PROCEDURE: After the risks benefits and alternatives of the procedure were thoroughly explained, informed consent was obtained.  The Pentax Gastroscope [id number] endoscope was introduced through the mouth and advanced to the second portion of the duodenum , Without limitations.  The instrument was slowly withdrawn as the mucosa was fully examined. Estimated blood loss is zero unless otherwise noted in this procedure report.    Esophagus normal and GEJ 40 cm from the incisors. Minimal antral erosions noted consistent with antral gastritis. Stomach otherwise normal in appearance. Duodenal bulb and 2nd portion of the duodenum normal in appearance. Endoscope removed and Bravo capsule catheter inserted and deployed in the usual fashion at 34 cm from the incisors (6 cm above the GEJ). Endoscope reinserted and confirmed successful placement of the Bravo capsule.       Retroflexed views revealed no abnormalities.     The scope was then withdrawn from the patient and the procedure completed.  COMPLICATIONS: There were no immediate complications.  ENDOSCOPIC IMPRESSION:     Minimal antral gastritis S/P Bravo capsule placement  RECOMMENDATIONS:     F/U on Bravo capsule results off of PPIs    eSigned:  Charlott RakesVincent Edilia Ghuman, MD 06/23/2014 3:13 PM    CC:  CPT CODES: ICD CODES:  The ICD and CPT codes recommended by this software are interpretations from the data that the clinical staff has captured with the software.   The verification of the translation of this report to the ICD and CPT codes and modifiers is the sole responsibility of the health care institution and practicing physician where this report was generated.  [employer] will not be held responsible for the validity of the ICD and CPT codes included on this report.  AMA assumes no liability for data contained or not contained herein. CPT is a Publishing rights managerregistered trademark of the [employer].  PATIENT NAME:  Terri Carter, Terri Carter MR#: [id number]

## 2014-06-23 NOTE — Interval H&P Note (Signed)
History and Physical Interval Note:  06/23/2014 2:34 PM  Terri Carter  has presented today for surgery, with the diagnosis of hoarsness/gerd  The various methods of treatment have been discussed with the patient and family. After consideration of risks, benefits and other options for treatment, the patient has consented to  Procedure(s): ESOPHAGOGASTRODUODENOSCOPY (EGD) WITH PROPOFOL (N/A) BRAVO PH STUDY (N/A) as a surgical intervention .  The patient's history has been reviewed, patient examined, no change in status, stable for surgery.  I have reviewed the patient's chart and labs.  Questions were answered to the patient's satisfaction.     Traevon Meiring C.

## 2014-06-23 NOTE — H&P (View-Only) (Signed)
Patient ID: Terri Carter, female   DOB: 1981-02-03, 34 y.o.   MRN: [id number]  Date of Initial H&P: 06/18/14  History reviewed, patient examined, no change in status, stable for surgery.

## 2014-06-23 NOTE — Anesthesia Postprocedure Evaluation (Signed)
  Anesthesia Post-op Note  Patient: Terri Carter  Procedure(s) Performed: Procedure(s): ESOPHAGOGASTRODUODENOSCOPY (EGD) WITH PROPOFOL (N/A) BRAVO PH STUDY (N/A)  Patient Location: PACU  Anesthesia Type: MAC  Level of Consciousness: awake and alert   Airway and Oxygen Therapy: Patient Spontanous Breathing  Post-op Pain: none  Post-op Assessment: Post-op Vital signs reviewed, Patient's Cardiovascular Status Stable and Respiratory Function Stable  Post-op Vital Signs: Reviewed  Filed Vitals:   06/23/14 1511  BP: 116/74  Pulse: 82  Temp: 36.4 C  Resp: 18    Complications: No apparent anesthesia complications

## 2014-06-23 NOTE — Anesthesia Preprocedure Evaluation (Signed)
Anesthesia Evaluation  Patient identified by MRN, date of birth, ID band Patient awake    Reviewed: Allergy & Precautions, NPO status , Patient's Chart, lab work & pertinent test results  Airway Mallampati: II   Neck ROM: full    Dental   Pulmonary Current Smoker,  breath sounds clear to auscultation        Cardiovascular negative cardio ROS  Rhythm:regular Rate:Normal     Neuro/Psych  Headaches, Anxiety    GI/Hepatic GERD-  ,  Endo/Other    Renal/GU      Musculoskeletal   Abdominal   Peds  Hematology   Anesthesia Other Findings   Reproductive/Obstetrics                             Anesthesia Physical Anesthesia Plan  ASA: II  Anesthesia Plan: MAC   Post-op Pain Management:    Induction: Intravenous  Airway Management Planned: Nasal Cannula  Additional Equipment:   Intra-op Plan:   Post-operative Plan:   Informed Consent: I have reviewed the patients History and Physical, chart, labs and discussed the procedure including the risks, benefits and alternatives for the proposed anesthesia with the patient or authorized representative who has indicated his/her understanding and acceptance.     Plan Discussed with: CRNA, Anesthesiologist and Surgeon  Anesthesia Plan Comments:         Anesthesia Quick Evaluation

## 2014-06-23 NOTE — Progress Notes (Signed)
Patient ID: Terri Carter, female   DOB: 05/28/1980, 33 y.o.   MRN: [id number]  Date of Initial H&P: 06/18/14  History reviewed, patient examined, no change in status, stable for surgery. 

## 2014-06-23 NOTE — Discharge Instructions (Signed)
Esophagogastroduodenoscopy °Care After °Refer to this sheet in the next few weeks. These instructions provide you with information on caring for yourself after your procedure. Your caregiver may also give you more specific instructions. Your treatment has been planned according to current medical practices, but problems sometimes occur. Call your caregiver if you have any problems or questions after your procedure.  °HOME CARE INSTRUCTIONS °· Do not eat or drink anything until the numbing medicine (local anesthetic) has worn off and your gag reflex has returned. You will know that the local anesthetic has worn off when you can swallow comfortably. °· Do not drive for 12 hours after the procedure or as directed by your caregiver. °· Only take medicines as directed by your caregiver. °SEEK MEDICAL CARE IF:  °· You cannot stop coughing. °· You are not urinating at all or less than usual. °SEEK IMMEDIATE MEDICAL CARE IF: °· You have difficulty swallowing. °· You cannot eat or drink. °· You have worsening throat or chest pain. °· You have dizziness, lightheadedness, or you faint. °· You have nausea or vomiting. °· You have chills. °· You have a fever. °· You have severe abdominal pain. °· You have black, tarry, or bloody stools. °Document Released: 01/31/2012 Document Reviewed: 01/31/2012 °Exit[employer]® Patient Information ©2015 [employer]. This information is not intended to replace advice given to you by your health care provider. Make sure you discuss any questions you have with your health care provider. ° °

## 2014-06-23 NOTE — Transfer of Care (Signed)
Immediate Anesthesia Transfer of Care Note  Patient: Terri Carter  Procedure(s) Performed: Procedure(s): ESOPHAGOGASTRODUODENOSCOPY (EGD) WITH PROPOFOL (N/A) BRAVO PH STUDY (N/A)  Patient Location: PACU  Anesthesia Type:MAC  Level of Consciousness: awake, alert  and oriented  Airway & Oxygen Therapy: Patient Spontanous Breathing and Patient connected to nasal cannula oxygen  Post-op Assessment: Report given to RN and Post -op Vital signs reviewed and stable  Post vital signs: Reviewed and stable  Last Vitals:  Filed Vitals:   06/23/14 1511  BP: 116/74  Pulse: 82  Temp: 36.4 C  Resp: 18    Complications: No apparent anesthesia complications

## 2014-06-24 ENCOUNTER — Encounter (HOSPITAL_COMMUNITY): Payer: Self-pay | Admitting: Gastroenterology

## 2015-09-20 ENCOUNTER — Ambulatory Visit (INDEPENDENT_AMBULATORY_CARE_PROVIDER_SITE_OTHER): Payer: 59 | Admitting: Licensed Clinical Social Worker

## 2015-09-20 DIAGNOSIS — F411 Generalized anxiety disorder: Secondary | ICD-10-CM

## 2015-09-27 ENCOUNTER — Ambulatory Visit: Payer: 59 | Admitting: Licensed Clinical Social Worker

## 2016-05-03 ENCOUNTER — Other Ambulatory Visit: Payer: Self-pay | Admitting: Specialist

## 2016-05-03 DIAGNOSIS — G4482 Headache associated with sexual activity: Secondary | ICD-10-CM

## 2018-04-30 HISTORY — PX: CERVICAL CONE BIOPSY: SUR198

## 2019-03-20 HISTORY — PX: XI ROBOTIC ASSISTED TOTAL HYSTERECTOMY WITH SALPINGECTOMY: SHX6835

## 2019-04-01 DIAGNOSIS — Z90710 Acquired absence of both cervix and uterus: Secondary | ICD-10-CM | POA: Insufficient documentation

## 2019-05-19 DIAGNOSIS — F330 Major depressive disorder, recurrent, mild: Secondary | ICD-10-CM | POA: Insufficient documentation

## 2019-07-17 ENCOUNTER — Emergency Department (HOSPITAL_COMMUNITY)
Admission: EM | Admit: 2019-07-17 | Discharge: 2019-07-17 | Disposition: A | Discharge status: Home / Self Care | Payer: 59 | Attending: Emergency Medicine | Admitting: Emergency Medicine

## 2019-07-17 ENCOUNTER — Emergency Department (HOSPITAL_COMMUNITY): Payer: 59

## 2019-07-17 ENCOUNTER — Encounter (HOSPITAL_COMMUNITY): Payer: Self-pay

## 2019-07-17 DIAGNOSIS — R102 Pelvic and perineal pain: Secondary | ICD-10-CM | POA: Diagnosis not present

## 2019-07-17 DIAGNOSIS — Z79899 Other long term (current) drug therapy: Secondary | ICD-10-CM | POA: Diagnosis not present

## 2019-07-17 DIAGNOSIS — N83201 Unspecified ovarian cyst, right side: Secondary | ICD-10-CM | POA: Diagnosis not present

## 2019-07-17 DIAGNOSIS — F17210 Nicotine dependence, cigarettes, uncomplicated: Secondary | ICD-10-CM | POA: Diagnosis not present

## 2019-07-17 DIAGNOSIS — R1031 Right lower quadrant pain: Secondary | ICD-10-CM | POA: Diagnosis present

## 2019-07-17 DIAGNOSIS — N9489 Other specified conditions associated with female genital organs and menstrual cycle: Secondary | ICD-10-CM

## 2019-07-17 LAB — COMPREHENSIVE METABOLIC PANEL
ALT: 11 U/L (ref 0–44)
AST: 13 U/L — ABNORMAL LOW (ref 15–41)
Albumin: 4.4 g/dL (ref 3.5–5.0)
Alkaline Phosphatase: 81 U/L (ref 38–126)
Anion gap: 13 (ref 5–15)
BUN: 10 mg/dL (ref 6–20)
CO2: 25 mmol/L (ref 22–32)
Calcium: 9.3 mg/dL (ref 8.9–10.3)
Chloride: 98 mmol/L (ref 98–111)
Creatinine, Ser: 0.82 mg/dL (ref 0.44–1.00)
GFR calc Af Amer: >60 mL/min (ref >60)
GFR calc non Af Amer: >60 mL/min (ref >60)
Glucose, Bld: 103 mg/dL — ABNORMAL HIGH (ref 70–99)
Potassium: 3.6 mmol/L (ref 3.5–5.1)
Sodium: 136 mmol/L (ref 135–145)
Total Bilirubin: 1.1 mg/dL (ref 0.3–1.2)
Total Protein: 7.1 g/dL (ref 6.5–8.1)

## 2019-07-17 LAB — CBC
HCT: 37.9 % (ref 36.0–46.0)
Hemoglobin: 12.4 g/dL (ref 12.0–15.0)
MCH: 30.5 pg (ref 26.0–34.0)
MCHC: 32.7 g/dL (ref 30.0–36.0)
MCV: 93.3 fL (ref 80.0–100.0)
Platelets: 370 10*3/uL (ref 150–400)
RBC: 4.06 MIL/uL (ref 3.87–5.11)
RDW: 12.4 % (ref 11.5–15.5)
WBC: 13 10*3/uL — ABNORMAL HIGH (ref 4.0–10.5)
nRBC: 0 % (ref 0.0–0.2)

## 2019-07-17 LAB — LIPASE, BLOOD: Lipase: 18 U/L (ref 11–51)

## 2019-07-17 LAB — WET PREP, GENITAL
Clue Cells Wet Prep HPF POC: NONE SEEN
Sperm: NONE SEEN
Trich, Wet Prep: NONE SEEN
Yeast Wet Prep HPF POC: NONE SEEN

## 2019-07-17 LAB — I-STAT BETA HCG BLOOD, ED (MC, WL, AP ONLY): I-stat hCG, quantitative: <5 m[IU]/mL (ref <5)

## 2019-07-17 MED ORDER — ONDANSETRON 4 MG PO TBDP: 4.0000 mg | ORAL_TABLET | Freq: Three times a day (TID) | ORAL | 0 refills | Status: AC | PRN

## 2019-07-17 MED ORDER — MORPHINE SULFATE (PF) 4 MG/ML IV SOLN
4.0000 mg | Freq: Once | INTRAVENOUS | Status: AC
  Administered 2019-07-17: 4 mg via INTRAVENOUS
  Filled 2019-07-17: qty 1

## 2019-07-17 MED ORDER — MORPHINE SULFATE (PF) 4 MG/ML IV SOLN
4.0000 mg | Freq: Once | INTRAVENOUS | Status: AC
  Administered 2019-07-17: 4 mg via INTRAVENOUS
  Filled 2019-07-17 (×2): qty 1

## 2019-07-17 MED ORDER — SODIUM CHLORIDE 0.9 % IV BOLUS
1000.0000 mL | Freq: Once | INTRAVENOUS | Status: AC
  Administered 2019-07-17: 1000 mL via INTRAVENOUS

## 2019-07-17 MED ORDER — SODIUM CHLORIDE 0.9% FLUSH
3.0000 mL | Freq: Once | INTRAVENOUS | Status: AC
  Administered 2019-07-17: 3 mL via INTRAVENOUS

## 2019-07-17 MED ORDER — ONDANSETRON 4 MG PO TBDP
4.0000 mg | ORAL_TABLET | Freq: Once | ORAL | Status: AC | PRN
  Administered 2019-07-17: 4 mg via ORAL
  Filled 2019-07-17: qty 1

## 2019-07-17 MED ORDER — HYDROMORPHONE HCL 1 MG/ML IJ SOLN
0.5000 mg | Freq: Once | INTRAMUSCULAR | Status: AC
  Administered 2019-07-17: 0.5 mg via INTRAVENOUS
  Filled 2019-07-17: qty 1

## 2019-07-17 MED ORDER — OXYCODONE-ACETAMINOPHEN 5-325 MG PO TABS: 1.0000 | ORAL_TABLET | Freq: Four times a day (QID) | ORAL | 0 refills | Status: AC | PRN

## 2019-07-17 MED ORDER — IOHEXOL 350 MG/ML SOLN
100.0000 mL | Freq: Once | INTRAVENOUS | Status: AC | PRN
  Administered 2019-07-17: 100 mL via INTRAVENOUS

## 2019-07-17 NOTE — ED Notes (Signed)
Patient verbalizes understanding of discharge instructions. Opportunity for questioning and answers were provided. Armband removed by staff, pt discharged from ED stable & ambulatory  

## 2019-07-17 NOTE — ED Provider Notes (Signed)
Accepted handoff at shift change from [facility]. Please see prior provider note for more detail.   Briefly: Patient is 39 y.o.   DDX: concern for appendicitis VS ovarian pathology  Plan: Follow-up on CT scan and disposition appropriately.    Physical Exam  BP 111/65   Pulse 72   Temp 98.5 F (36.9 C) (Oral)   Resp 17   Ht 5\' 7"  (1.702 m)   Wt 68 kg   SpO2 99%   BMI 23.49 kg/m   CONSTITUTIONAL:  well-appearing, NAD NEURO:  Alert and oriented x 3, no focal deficits EYES:  pupils equal and reactive ENT/NECK:  trachea midline, no JVD CARDIO:  reg rate, reg rhythm, well-perfused PULM:  None labored breathing GI/GU:  Abdomen non-distended, significant tenderness in the right lower quadrant with voluntary guarding, no involuntary guarding, no rebound. Pelvic exam with right adnexal tenderness to palpation.  Cervix does not appear to be present although may be retroverted.  No significant vaginal discharge of abnormal appearance.  No blood in the vaginal vault. MSK/SPINE:  No gross deformities, no edema SKIN:  no rash obvious, atraumatic, no ecchymosis  PSYCH:  Appropriate speech and behavior   ED Course/Procedures     Procedures  Results for orders placed or performed during the hospital encounter of 07/17/19  Lipase, blood  Result Value Ref Range   Lipase 18 11 - 51 U/L  Comprehensive metabolic panel  Result Value Ref Range   Sodium 136 135 - 145 mmol/L   Potassium 3.6 3.5 - 5.1 mmol/L   Chloride 98 98 - 111 mmol/L   CO2 25 22 - 32 mmol/L   Glucose, Bld 103 (H) 70 - 99 mg/dL   BUN 10 6 - 20 mg/dL   Creatinine, Ser 0.82 0.44 - 1.00 mg/dL   Calcium 9.3 8.9 - 10.3 mg/dL   Total Protein 7.1 6.5 - 8.1 g/dL   Albumin 4.4 3.5 - 5.0 g/dL   AST 13 (L) 15 - 41 U/L   ALT 11 0 - 44 U/L   Alkaline Phosphatase 81 38 - 126 U/L   Total Bilirubin 1.1 0.3 - 1.2 mg/dL   GFR calc non Af Amer >60 >60 mL/min   GFR calc Af Amer >60 >60 mL/min   Anion gap 13 5 - 15  CBC  Result  Value Ref Range   WBC 13.0 (H) 4.0 - 10.5 K/uL   RBC 4.06 3.87 - 5.11 MIL/uL   Hemoglobin 12.4 12.0 - 15.0 g/dL   HCT 37.9 36.0 - 46.0 %   MCV 93.3 80.0 - 100.0 fL   MCH 30.5 26.0 - 34.0 pg   MCHC 32.7 30.0 - 36.0 g/dL   RDW 12.4 11.5 - 15.5 %   Platelets 370 150 - 400 K/uL   nRBC 0.0 0.0 - 0.2 %  I-Stat beta hCG blood, ED  Result Value Ref Range   I-stat hCG, quantitative <5.0 <5 mIU/mL   Comment 3           CT ABDOMEN PELVIS W CONTRAST  Result Date: 07/17/2019 CLINICAL DATA:  Acute right lower quadrant abdominal pain. EXAM: CT ABDOMEN AND PELVIS WITH CONTRAST TECHNIQUE: Multidetector CT imaging of the abdomen and pelvis was performed using the standard protocol following bolus administration of intravenous contrast. CONTRAST:  137mL OMNIPAQUE IOHEXOL 350 MG/ML SOLN COMPARISON:  None. FINDINGS: Lower chest: No acute abnormality. Hepatobiliary: No focal liver abnormality is seen. No gallstones, gallbladder wall thickening, or biliary dilatation. Pancreas: Unremarkable. No pancreatic ductal  dilatation or surrounding inflammatory changes. Spleen: Normal in size without focal abnormality. Adrenals/Urinary Tract: Adrenal glands appear normal. Small nonobstructive left renal calculus is noted. No hydronephrosis or renal obstruction is noted. Urinary bladder is unremarkable. Stomach/Bowel: Stomach is within normal limits. Appendix appears normal. No evidence of bowel wall thickening, distention, or inflammatory changes. Vascular/Lymphatic: Aortic atherosclerosis. No enlarged abdominal or pelvic lymph nodes. Reproductive: The uterus is not clearly visualized. 7.7 x 3.4 cm complex oval-shaped abnormality is seen in the right adnexal region concerning for complex right ovarian or adnexal mass. Left ovary is unremarkable. Other: No hernia is noted. However, minimal amount of free fluid is noted around the liver and spleen with a mild amount of fluid seen in the pelvis. Musculoskeletal: No acute or  significant osseous findings. IMPRESSION: 1. 7.7 x 3.4 cm complex oval-shaped abnormality seen in the right adnexal region concerning for complex right ovarian or adnexal mass. Pelvic ultrasound is recommended for further evaluation. 2. Small nonobstructive left renal calculus. No hydronephrosis or renal obstruction is noted. 3. Mild amount of free fluid is noted in the pelvis, with minimal fluid noted around the spleen and liver. Aortic Atherosclerosis (ICD10-I70.0). Electronically Signed   By: Lupita Raider M.D.   On: 07/17/2019 16:35    MDM    5:46 PM --discussed with OB/GYN Dr. Macon Large who recommends following up on pelvic ultrasound.    Discussed with Dr. Macon Large results of pelvic ultrasound. As there is blood flow and patient has no uterus there is no emergent need for detorsion. Low suspicion for torsion overall given her symptoms and that the chronicity has been ongoing for 2 days. Patient will be provided with pain medication in the form of Percocet, Tylenol, ibuprofen and will follow up for gynecologic doctor --patient states that she believes she'll be will see her doctor tomorrow or early next week.    I discussed this case with my attending physician who cosigned this note including patient's presenting symptoms, physical exam, and planned diagnostics and interventions. Attending physician stated agreement with plan or made changes to plan which were implemented.   The medical records were personally reviewed by myself. I personally reviewed all lab results and interpreted all imaging studies and either concurred with their official read or contacted radiology for clarification. Additional history obtained from old records/EMS/family members.  This patient appears reasonably screened and I doubt any other medical condition requiring further workup, evaluation, or treatment in the ED at this time prior to discharge.   Patient's vitals are WNL apart from vital sign abnormalities  discussed above, patient is in NAD, and able to ambulate in the ED at their baseline and able to tolerate PO.  Pain has been managed or a plan has been made for home management and has no complaints prior to discharge. Patient is comfortable with above plan and for discharge at this time. All questions were answered prior to disposition. Results from the ER workup discussed with the patient face to face and all questions answered to the best of my ability. The patient is safe for discharge with strict return precautions. Patient appears safe for discharge with appropriate follow-up. Conveyed my impression with the patient and they voiced understanding and are agreeable to plan.   An After Visit Summary was printed and given to the patient.  Portions of this note were generated with Scientist, clinical (histocompatibility and immunogenetics). Dictation errors may occur despite best attempts at proofreading.      Solon Augusta [place], [place] 07/17/19 2047  Rolan Bucco, MD 07/17/19 [phone]

## 2019-07-17 NOTE — Discharge Instructions (Addendum)
Your ultrasound was reassuring and that there is blood flow to your right ovary. On CT scan your appendix was normal. I'm providing you with pain medication to use if ibuprofen and Tylenol are not sufficient. I do recommend that you take Tylenol ibuprofen regularly as directed below however you may use the Percocet if your pain is not controlled.  OB/GYN/gynecologic doctor. I also prescribed you Zofran as needed for nausea.

## 2019-07-17 NOTE — ED Triage Notes (Signed)
Pt arrives to ED w/ RLQ abdominal pain 9/10 pain that started on Tuesday. Pt endorses n/v. Pt states she has not had BM since Tuesday. Pt denies fever.

## 2019-07-17 NOTE — Consult Note (Signed)
GYNECOLOGY ATTENDING TELEPHONE CONSULT NOTE  Consult Date: 07/17/2019  Reason for Consult: Right ovarian enlargement and pain concerning for torsion, s/p hysterectomy in 02/2019 Consulting Provider: Solon Augusta, PA-C in [facility]  History of Present Illness: Terri Carter is an 39 y.o. female with history of robotic assisted total hysterectomy and bilateral salpingectomy in 02/2019, presented to [facility] ER today with two day history of right lower abdominal pain.  Pain was severe and intermittent in nature, associated with nausea and vomiting. After one day, the nausea and vomiting stopped, but the pain got worse and is now sharp and constant. No fevers but she did report chills. No bleeding or vaginal discharge.  Examination by ED provider revealed RLQ tenderness but no peritoneal signs, soft abdomen.  WBC 13.0 and CT scan showed 7.7 x 3.4 cm complex oval-shaped abnormality seen in the right adnexal region concerning for complex right ovarian or adnexal mass.  The concern was about torsion, and ultrasound was ordered. Ultrasound showed : Enlarged heterogeneous echogenic right ovary measuring 8.2 x 5.6 x 8.3 cm. Blood flow is noted with demonstration of both arterial and venous waveforms. There is adjacent free fluid in the right adnexa and pelvic cul-de-sac. No torsion at this time.   Of note, patient is followed by a GYN Provider at [facility] who was the surgeon that performed her hysterectomy (done for cervical dysplasia, no residual dysplasia seen in pathology specimen).  Imaging: CT ABDOMEN PELVIS W CONTRAST  Result Date: 07/17/2019 CLINICAL DATA:  Acute right lower quadrant abdominal pain. EXAM: CT ABDOMEN AND PELVIS WITH CONTRAST TECHNIQUE: Multidetector CT imaging of the abdomen and pelvis was performed using the standard protocol following bolus administration of intravenous contrast. CONTRAST:  OMNIPAQUE IOHEXOL 350 MG/ML SOLN COMPARISON:  None. FINDINGS: Lower chest:  No acute abnormality. Hepatobiliary: No focal liver abnormality is seen. No gallstones, gallbladder wall thickening, or biliary dilatation. Pancreas: Unremarkable. No pancreatic ductal dilatation or surrounding inflammatory changes. Spleen: Normal in size without focal abnormality. Adrenals/Urinary Tract: Adrenal glands appear normal. Small nonobstructive left renal calculus is noted. No hydronephrosis or renal obstruction is noted. Urinary bladder is unremarkable. Stomach/Bowel: Stomach is within normal limits. Appendix appears normal. No evidence of bowel wall thickening, distention, or inflammatory changes. Vascular/Lymphatic: Aortic atherosclerosis. No enlarged abdominal or pelvic lymph nodes. Reproductive: The uterus is not clearly visualized. 7.7 x 3.4 cm complex oval-shaped abnormality is seen in the right adnexal region concerning for complex right ovarian or adnexal mass. Left ovary is unremarkable. Other: No hernia is noted. However, minimal amount of free fluid is noted around the liver and spleen with a mild amount of fluid seen in the pelvis. Musculoskeletal: No acute or significant osseous findings. IMPRESSION: 1. 7.7 x 3.4 cm complex oval-shaped abnormality seen in the right adnexal region concerning for complex right ovarian or adnexal mass. Pelvic ultrasound is recommended for further evaluation. 2. Small nonobstructive left renal calculus. No hydronephrosis or renal obstruction is noted. 3. Mild amount of free fluid is noted in the pelvis, with minimal fluid noted around the spleen and liver. Aortic Atherosclerosis (ICD10-I70.0). Electronically Signed   By: Lupita Raider M.D.   On: 07/17/2019 16:35   US PELVIC COMPLETE W TRANSVAGINAL AND TORSION R/O  Result Date: 07/17/2019 CLINICAL DATA:  Right lower quadrant abdominal pain. Adnexal mass on CT. EXAM: TRANSABDOMINAL AND TRANSVAGINAL ULTRASOUND OF PELVIS DOPPLER ULTRASOUND OF OVARIES TECHNIQUE: Both transabdominal and transvaginal ultrasound  examinations of the pelvis were performed. Transabdominal technique was  performed for global imaging of the pelvis including uterus, ovaries, adnexal regions, and pelvic cul-de-sac. It was necessary to proceed with endovaginal exam following the transabdominal exam to visualize the ovaries and adnexa. Color and duplex Doppler ultrasound was utilized to evaluate blood flow to the ovaries. COMPARISON:  CT earlier this day FINDINGS: Uterus Surgically absent. Right ovary Measurements: 8.2 x 5.6 x 8.3 cm = volume: 200 mL. The ovary is enlarged and heterogeneously echogenic slightly bilobed in shape. Few peripheral follicles are seen. Blood flow is noted with both ovarian and venous flow. Left ovary Measurements: 4.0 x 2.5 x 2.7 cm = volume: 13.9 mL. Follicular cyst measures 2.0 cm. Additional physiologic follicles. Normal blood flow is seen. Pulsed Doppler evaluation of both ovaries demonstrates normal low-resistance arterial and venous waveforms. Other findings Small to moderate free fluid is seen in the right adnexa and pelvic cul-de-sac. IMPRESSION: 1. Enlarged heterogeneous echogenic right ovary measuring 8.2 x 5.6 x 8.3 cm. Blood flow is noted with demonstration of both arterial and venous waveforms. There is adjacent free fluid in the right adnexa and pelvic cul-de-sac. Milder is no torsion at this time, the possibility of intermittent torsion is raised given the ovarian size and adjacent fluid. 2. Recommend gyn consultation. There is no cystic mass in the ovary, and no discrete solid mass other than heterogeneous ovarian enlargement. If further imaging characterization is desired, recommend pelvic MRI. Given patient's relatively recent hysterectomy 4 months ago, correlation with operative report for comment on the right ovary may be helpful. 3. Physiologic follicular cyst in the left ovary with normal blood flow. Electronically Signed   By: Keith Rake M.D.   On: 07/17/2019 19:14     Recommendations: Ultrasound findings rule out current torsion. However, given her pain and enlarged right ovary, outpatient follow up with her gynecologist is recommended.  She may need pelvic MRI for further characterization of this mass, but this can be done outpatient. Patient can be given oral analgesia as needed to control her pain. No need for urgent surgical intervention at this time. This plan was communicated to the provider.   Thank you for this telephone consult.  If additional recommendations are needed, please call [phone] [facility] OB/GYN Consult Attending Monday-Friday 8am - 5pm) or [phone] [facility] OB/GYN Attending On Call all day, every day).    I spent approximately 5 minutes directly consulting with the provider and verbally discussing this case. Additional 20 minutes was spent performing chart review and documentation and reviewing the imaging.     Verita Schneiders, MD, [place] for [employer], [place]

## 2019-07-17 NOTE — ED Notes (Signed)
US

## 2019-07-17 NOTE — ED Provider Notes (Signed)
[facility] EMERGENCY DEPARTMENT Provider Note   CSN: [id number] Arrival date & time: 07/17/19  1059   History Chief Complaint  Patient presents with  . Abdominal Pain    Terri Carter is a 39 y.o. female with history of depression, cervical dysplasia s/p hysterectomy who presents with abdominal pain. Patient states that she developed right lower quadrant abdominal pain around noon 2 days ago. She went to have a bowel movement and then started to have multiple episodes of nausea and vomiting throughout the afternoon and night. She stopped vomiting yesterday but the pain in the right lower quadrant has been gradually worsening. It feels like a sharp cramp. It is constant and radiates to the back. She reports associated chills. She has not been eating but has been drinking fluids and urinating normally. LBM was 2 days ago and she feels the need to have a BM but can't. She denies fever, chest pain, SOB, vaginal discharge. She denies any other abdominal surgeries.  HPI   Past Medical History:  Diagnosis Date  . Anxiety   . GERD (gastroesophageal reflux disease)   . Headache    cluster  . Voice hoarseness last several months    Patient Active Problem List   Diagnosis Date Noted  . Hoarseness 06/23/2014  . GERD (gastroesophageal reflux disease) 06/23/2014    Past Surgical History:  Procedure Laterality Date  . BRAVO PH STUDY N/A 06/23/2014   Procedure: BRAVO PH STUDY;  Surgeon: Charlott Rakes, MD;  Location: [facility];  Service: Endoscopy;  Laterality: N/A;  . ESOPHAGOGASTRODUODENOSCOPY (EGD) WITH PROPOFOL N/A 06/23/2014   Procedure: ESOPHAGOGASTRODUODENOSCOPY (EGD) WITH PROPOFOL;  Surgeon: Charlott Rakes, MD;  Location: [facility];  Service: Endoscopy;  Laterality: N/A;  . wistom teeth extraction  8 years ago     OB History   No obstetric history on file.     No family history on file.  Social History   Tobacco Use  . Smoking status:  Current Every Day Smoker    Types: Cigars  . Smokeless tobacco: Never Used  . Tobacco comment: ciagarettes quit 2010 smoked one half to 1 ppd for 8 years now amokes cigars   Substance Use Topics  . Alcohol use: Yes    Comment: occasional wine  . Drug use: Yes    Types: Marijuana    Comment: occasional marijuana use    Home Medications Prior to Admission medications   Medication Sig Start Date End Date Taking? Authorizing Provider  buPROPion (WELLBUTRIN XL) 300 MG 24 hr tablet Take 300 mg by mouth at bedtime.     [provider]  ibuprofen (ADVIL,MOTRIN) 200 MG tablet Take 400 mg by mouth every 6 (six) hours as needed for moderate pain.     [provider]  SUMAtriptan (IMITREX) 20 MG/ACT nasal spray Place 20 mg into the nose every 2 (two) hours as needed for migraine or headache. May repeat in 2 hours if headache persists or recurs.    [provider]    Allergies    Patient has no known allergies.  Review of Systems   Review of Systems  Constitutional: Positive for appetite change and chills. Negative for fever.  Respiratory: Negative for shortness of breath.   Cardiovascular: Negative for chest pain.  Gastrointestinal: Positive for abdominal pain, nausea and vomiting. Negative for constipation and diarrhea.  Genitourinary: Negative for dysuria, flank pain, pelvic pain, vaginal bleeding and vaginal discharge.  All other systems reviewed and are negative.  Physical Exam Updated Vital Signs BP 116/77 (BP Location: Right Arm)   Temp 98.5 F (36.9 C) (Oral)   Resp 16   Ht 5\' 7"  (1.702 m)   Wt 68 kg   SpO2 100%   BMI 23.49 kg/m   Physical Exam Vitals and nursing note reviewed.  Constitutional:      General: She is not in acute distress.    Appearance: She is well-developed. She is not ill-appearing.  HENT:     Head: Normocephalic and atraumatic.  Eyes:     General: No scleral icterus.       Right eye: No discharge.        Left eye: No  discharge.     Conjunctiva/sclera: Conjunctivae normal.     Pupils: Pupils are equal, round, and reactive to light.  Cardiovascular:     Rate and Rhythm: Normal rate and regular rhythm.  Pulmonary:     Effort: Pulmonary effort is normal. No respiratory distress.     Breath sounds: Normal breath sounds.  Abdominal:     General: Abdomen is flat. Bowel sounds are normal. There is no distension.     Palpations: Abdomen is soft.     Tenderness: There is abdominal tenderness in the right lower quadrant. There is no right CVA tenderness or left CVA tenderness.     Hernia: No hernia is present.  Musculoskeletal:     Cervical back: Normal range of motion.  Skin:    General: Skin is warm and dry.  Neurological:     Mental Status: She is alert and oriented to person, place, and time.  Psychiatric:        Behavior: Behavior normal.     ED Results / Procedures / Treatments   Labs (all labs ordered are listed, but only abnormal results are displayed) Labs Reviewed  COMPREHENSIVE METABOLIC PANEL - Abnormal; Notable for the following components:      Result Value   Glucose, Bld 103 (*)    AST 13 (*)    All other components within normal limits  CBC - Abnormal; Notable for the following components:   WBC 13.0 (*)    All other components within normal limits  LIPASE, BLOOD  URINALYSIS, ROUTINE W REFLEX MICROSCOPIC  I-STAT BETA HCG BLOOD, ED ([facility], [facility], [facility] ONLY)    EKG None  Radiology No results found.  Procedures Procedures (including critical care time)  Medications Ordered in ED Medications  sodium chloride flush (NS) 0.9 % injection 3 mL (has no administration in time range)  ondansetron (ZOFRAN-ODT) disintegrating tablet 4 mg (4 mg Oral Given 07/17/19 1131)    ED Course  I have reviewed the triage vital signs and the nursing notes.  Pertinent labs & imaging results that were available during my care of the patient were reviewed by me and considered in my medical decision  making (see chart for details).  39 year old female presents with right lower quadrant abdominal pain with nausea, vomiting, decreased appetite for the past 2 days.  Her vital signs are reassuring here.  On exam she is significantly tender in the right lower quadrant.  Pelvic exam was deferred due to history of hysterectomy and no vaginal complaints.  CBC shows leukocytosis of 13.  CMP and lipase are reassuring.  Will order CT of the abdomen and pelvis to r/o appendicitis, kidney stone, pyelo, ovarian cyst and provide pain control.  At shift change CT and UA are still pending.  Care signed out to [facility].  If CT is negative would consider Pelvic US to r/o torsion as well.   MDM Rules/Calculators/A&P                       Final Clinical Impression(s) / ED Diagnoses Final diagnoses:  Right lower quadrant abdominal pain    Rx / DC Orders ED Discharge Orders    None       Recardo Evangelist, PA-C 07/17/19 1540    Charlesetta Shanks, MD 08/06/19 1408

## 2019-07-18 LAB — GC/CHLAMYDIA PROBE AMP (~~LOC~~) NOT AT ARMC
Chlamydia: NEGATIVE
Comment: NEGATIVE
Comment: NORMAL
Neisseria Gonorrhea: NEGATIVE

## 2019-07-19 ENCOUNTER — Other Ambulatory Visit: Payer: Self-pay

## 2019-07-19 ENCOUNTER — Encounter (HOSPITAL_COMMUNITY): Payer: Self-pay | Admitting: Emergency Medicine

## 2019-07-19 ENCOUNTER — Emergency Department (HOSPITAL_COMMUNITY)
Admission: EM | Admit: 2019-07-19 | Discharge: 2019-07-19 | Disposition: A | Discharge status: Home / Self Care | Payer: 59 | Attending: Emergency Medicine | Admitting: Emergency Medicine

## 2019-07-19 DIAGNOSIS — R112 Nausea with vomiting, unspecified: Secondary | ICD-10-CM | POA: Insufficient documentation

## 2019-07-19 DIAGNOSIS — Z79899 Other long term (current) drug therapy: Secondary | ICD-10-CM | POA: Insufficient documentation

## 2019-07-19 DIAGNOSIS — F1210 Cannabis abuse, uncomplicated: Secondary | ICD-10-CM | POA: Insufficient documentation

## 2019-07-19 DIAGNOSIS — F17290 Nicotine dependence, other tobacco product, uncomplicated: Secondary | ICD-10-CM | POA: Insufficient documentation

## 2019-07-19 DIAGNOSIS — Z91040 Latex allergy status: Secondary | ICD-10-CM | POA: Insufficient documentation

## 2019-07-19 DIAGNOSIS — R1031 Right lower quadrant pain: Secondary | ICD-10-CM

## 2019-07-19 DIAGNOSIS — N838 Other noninflammatory disorders of ovary, fallopian tube and broad ligament: Secondary | ICD-10-CM | POA: Insufficient documentation

## 2019-07-19 LAB — CBC WITH DIFFERENTIAL/PLATELET
Abs Immature Granulocytes: 0.05 10*3/uL (ref 0.00–0.07)
Basophils Absolute: 0 10*3/uL (ref 0.0–0.1)
Basophils Relative: 0 %
Eosinophils Absolute: 0.2 10*3/uL (ref 0.0–0.5)
Eosinophils Relative: 1 %
HCT: 32.5 % — ABNORMAL LOW (ref 36.0–46.0)
Hemoglobin: 10.6 g/dL — ABNORMAL LOW (ref 12.0–15.0)
Immature Granulocytes: 0 %
Lymphocytes Relative: 10 %
Lymphs Abs: 1.3 10*3/uL (ref 0.7–4.0)
MCH: 30.4 pg (ref 26.0–34.0)
MCHC: 32.6 g/dL (ref 30.0–36.0)
MCV: 93.1 fL (ref 80.0–100.0)
Monocytes Absolute: 0.9 10*3/uL (ref 0.1–1.0)
Monocytes Relative: 8 %
Neutro Abs: 10 10*3/uL — ABNORMAL HIGH (ref 1.7–7.7)
Neutrophils Relative %: 81 %
Platelets: 279 10*3/uL (ref 150–400)
RBC: 3.49 MIL/uL — ABNORMAL LOW (ref 3.87–5.11)
RDW: 12.1 % (ref 11.5–15.5)
WBC: 12.5 10*3/uL — ABNORMAL HIGH (ref 4.0–10.5)
nRBC: 0 % (ref 0.0–0.2)

## 2019-07-19 LAB — COMPREHENSIVE METABOLIC PANEL
ALT: 11 U/L (ref 0–44)
AST: 13 U/L — ABNORMAL LOW (ref 15–41)
Albumin: 3.7 g/dL (ref 3.5–5.0)
Alkaline Phosphatase: 62 U/L (ref 38–126)
Anion gap: 11 (ref 5–15)
BUN: 7 mg/dL (ref 6–20)
CO2: 25 mmol/L (ref 22–32)
Calcium: 8.7 mg/dL — ABNORMAL LOW (ref 8.9–10.3)
Chloride: 101 mmol/L (ref 98–111)
Creatinine, Ser: 0.83 mg/dL (ref 0.44–1.00)
GFR calc Af Amer: >60 mL/min (ref >60)
GFR calc non Af Amer: >60 mL/min (ref >60)
Glucose, Bld: 92 mg/dL (ref 70–99)
Potassium: 3.2 mmol/L — ABNORMAL LOW (ref 3.5–5.1)
Sodium: 137 mmol/L (ref 135–145)
Total Bilirubin: 1.1 mg/dL (ref 0.3–1.2)
Total Protein: 6.6 g/dL (ref 6.5–8.1)

## 2019-07-19 LAB — URINALYSIS, ROUTINE W REFLEX MICROSCOPIC
Bilirubin Urine: NEGATIVE
Glucose, UA: NEGATIVE mg/dL
Ketones, ur: 80 mg/dL — AB
Leukocytes,Ua: NEGATIVE
Nitrite: NEGATIVE
Protein, ur: NEGATIVE mg/dL
Specific Gravity, Urine: 1.013 (ref 1.005–1.030)
pH: 6 (ref 5.0–8.0)

## 2019-07-19 MED ORDER — KETOROLAC TROMETHAMINE 15 MG/ML IJ SOLN
15.0000 mg | Freq: Once | INTRAMUSCULAR | Status: AC
  Administered 2019-07-19: 15 mg via INTRAVENOUS
  Filled 2019-07-19: qty 1

## 2019-07-19 MED ORDER — LORAZEPAM 1 MG PO TABS
1.0000 mg | ORAL_TABLET | Freq: Once | ORAL | Status: AC
  Administered 2019-07-19: 1 mg via ORAL
  Filled 2019-07-19: qty 1

## 2019-07-19 MED ORDER — HYDROMORPHONE HCL 1 MG/ML IJ SOLN: 1.0000 mg | Freq: Once | INTRAMUSCULAR | Status: DC

## 2019-07-19 MED ORDER — LORAZEPAM 2 MG/ML IJ SOLN: 1.0000 mg | Freq: Once | INTRAMUSCULAR | Status: DC

## 2019-07-19 MED ORDER — SODIUM CHLORIDE 0.9 % IV BOLUS
1000.0000 mL | Freq: Once | INTRAVENOUS | Status: AC
  Administered 2019-07-19: 1000 mL via INTRAVENOUS

## 2019-07-19 MED ORDER — ONDANSETRON HCL 4 MG/2ML IJ SOLN
4.0000 mg | Freq: Once | INTRAMUSCULAR | Status: AC
  Administered 2019-07-19: 4 mg via INTRAVENOUS
  Filled 2019-07-19: qty 2

## 2019-07-19 MED ORDER — HYDROMORPHONE HCL 4 MG PO TABS: 4.0000 mg | ORAL_TABLET | ORAL | 0 refills | Status: AC | PRN

## 2019-07-19 MED ORDER — HYDROMORPHONE HCL 1 MG/ML IJ SOLN
0.5000 mg | Freq: Once | INTRAMUSCULAR | Status: AC
  Administered 2019-07-19: 0.5 mg via INTRAVENOUS
  Filled 2019-07-19: qty 1

## 2019-07-19 NOTE — ED Triage Notes (Signed)
Pt reports RLQ pain since Tuesday.  States she was seen in ED for same on Thursday and diagnosed with enlarged R ovary.  Reports nausea and vomiting.

## 2019-07-19 NOTE — ED Notes (Signed)
Pt upset and anxious regarding discharge; crying and hyperventilating. MD notified, orders placed. Emotional support provided for pt. Pt calm upon discharge. Pt verbalized understanding of discharge instructions, no further questions for staff at this time.

## 2019-07-19 NOTE — ED Provider Notes (Signed)
[facility] EMERGENCY DEPARTMENT Provider Note   CSN: [id number] Arrival date & time: 07/19/19  1025     History Chief Complaint  Patient presents with  . Abdominal Pain    Terri Carter is a 39 y.o. female who is s/p total hysterectomy and bilateral salpingectomy for cervical dysplasia who presents with RLQ abdominal pain. She was seen in the ED 2 days ago for the same symptoms. She was ultimately diagnosed with a large (8.2 x 5.6 x 8.3) complex ovarian/adnexal mass. [place] did not show evidence of torsion. OBGYN was consulted and they recommended outpatient f/u. She was prescribed Percocet and Zofran. She returns today for ongoing symptoms. Pain and nausea has been uncontrolled with oral meds. She states pain medicine worked for about 2 hours and she tried Tylenol and Motrin with it as well. The pain is sharp and throbbing in the RLQ. Pain is 8/10. She called her OBGYN but they could not see her till Monday. She does not feel she can make it until then.  HPI     Past Medical History:  Diagnosis Date  . Anxiety   . GERD (gastroesophageal reflux disease)   . Headache    cluster  . Voice hoarseness last several months    Patient Active Problem List   Diagnosis Date Noted  . Hoarseness 06/23/2014  . GERD (gastroesophageal reflux disease) 06/23/2014    Past Surgical History:  Procedure Laterality Date  . BRAVO [place] STUDY N/A 06/23/2014   Procedure: BRAVO [place];  Surgeon: Wilford Corner, MD;  Location: [facility];  Service: Endoscopy;  Laterality: N/A;  . 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 ESOPHAGOGASTRODUODENOSCOPY (EGD) WITH PROPOFOL N/A 06/23/2014   Procedure: ESOPHAGOGASTRODUODENOSCOPY (EGD) WITH PROPOFOL;  Surgeon: Wilford Corner, MD;  Location: [facility];  Service: Endoscopy;  Laterality: N/A;  . wistom teeth extraction  8 years ago  . XI ROBOTIC  ASSISTED TOTAL HYSTERECTOMY WITH SALPINGECTOMY  03/20/2019   Done at [facility] for high grade cervical dysplasia. Pathology showed no residual dysplasia (had CKC prior to hysterectomy)     OB History   No obstetric history on file.     No family history on file.  Social History   Tobacco Use  . Smoking status: Current Every Day Smoker    Types: Cigars  . Smokeless tobacco: Never Used  . Tobacco comment: ciagarettes quit 2010 smoked one half to 1 ppd for 8 years now amokes cigars   Substance Use Topics  . Alcohol use: Yes    Comment: occasional wine  . Drug use: Yes    Types: Marijuana    Comment: occasional marijuana use    Home Medications Prior to Admission medications   Medication Sig Start Date End Date Taking? Authorizing Provider  acetaminophen (TYLENOL) 500 MG tablet Take 1,000 mg by mouth every 6 (six) hours as needed for mild pain.    [provider]  buPROPion (WELLBUTRIN XL) 300 MG 24 hr tablet Take 300 mg by mouth at bedtime.     [provider]  clonazePAM (KLONOPIN) 0.5 MG tablet Take 0.5 mg by mouth daily as needed for sleep. 04/04/19   [provider]  escitalopram (LEXAPRO) 20 MG tablet Take 20 mg by mouth daily. 05/19/19   [provider]  hydrochlorothiazide (MICROZIDE) 12.5 MG capsule Take 12.5 mg by mouth at bedtime.  07/02/19   [provider]  hydrOXYzine (VISTARIL) 50 MG capsule Take 50 mg by mouth 2 (two) times daily as needed for anxiety. 05/19/19   [provider]  ibuprofen (ADVIL,MOTRIN) 200 MG tablet Take 400-800 mg by mouth every 6 (six) hours as needed for moderate pain.     [provider]  ondansetron (ZOFRAN ODT) 4 MG disintegrating tablet Take 1 tablet (4 mg total) by mouth every 8 (eight) hours as needed for nausea or vomiting. 07/17/19   Gailen Shelter, PA  oxyCODONE-acetaminophen (PERCOCET/ROXICET) 5-325 MG tablet Take 1 tablet by mouth every 6 (six) hours as needed for up to 5 days for  severe pain. 07/17/19 07/22/19  Gailen Shelter, PA    Allergies    Buspirone, Desvenlafaxine, Clindamycin/lincomycin, and Latex  Review of Systems   Review of Systems  Constitutional: Negative for chills and fever.  Respiratory: Negative for cough.   Cardiovascular: Negative for chest pain.  Gastrointestinal: Positive for abdominal distention, abdominal pain, constipation, nausea and vomiting. Negative for diarrhea.  Genitourinary: Negative for dysuria and flank pain.  All other systems reviewed and are negative.   Physical Exam Updated Vital Signs BP 113/78 (BP Location: Left Arm)   Pulse 98   Temp 98.9 F (37.2 C) (Oral)   Resp 14   Ht 5\' 7"  (1.702 m)   Wt 68.5 kg   SpO2 100%   BMI 23.65 kg/m   Physical Exam Vitals and nursing note reviewed.  Constitutional:      General: She is not in acute distress.    Appearance: She is well-developed. She is not ill-appearing.  HENT:     Head: Normocephalic and atraumatic.  Eyes:     General: No scleral icterus.       Right eye: No discharge.        Left eye: No discharge.     Conjunctiva/sclera: Conjunctivae normal.     Pupils: Pupils are equal, round, and reactive to light.  Cardiovascular:     Rate and Rhythm: Normal rate.  Pulmonary:     Effort: Pulmonary effort is normal. No respiratory distress.  Abdominal:     General: Abdomen is flat. Bowel sounds are normal. There is no distension.     Palpations: Abdomen is soft.     Tenderness: There is abdominal tenderness in the right lower quadrant. There is guarding.  Musculoskeletal:     Cervical back: Normal range of motion.  Skin:    General: Skin is warm and dry.  Neurological:     Mental Status: She is alert and oriented to person, place, and time.  Psychiatric:        Behavior: Behavior normal.     ED Results / Procedures / Treatments   Labs (all labs ordered are listed, but only abnormal results are displayed) Labs Reviewed  COMPREHENSIVE METABOLIC PANEL -  Abnormal; Notable for the following components:      Result Value   Potassium 3.2 (*)    Calcium 8.7 (*)    AST 13 (*)    All other components within normal limits  CBC WITH DIFFERENTIAL/PLATELET - Abnormal; Notable for the following components:   WBC 12.5 (*)    RBC 3.49 (*)    Hemoglobin 10.6 (*)    HCT 32.5 (*)    Neutro Abs 10.0 (*)    All other components within normal limits  URINALYSIS, ROUTINE W REFLEX MICROSCOPIC - Abnormal; Notable for the following components:   APPearance HAZY (*)    Hgb urine dipstick MODERATE (*)  Ketones, ur 80 (*)    Bacteria, UA RARE (*)    All other components within normal limits    EKG None  Radiology CT ABDOMEN PELVIS W CONTRAST  Result Date: 07/17/2019 CLINICAL DATA:  Acute right lower quadrant abdominal pain. EXAM: CT ABDOMEN AND PELVIS WITH CONTRAST TECHNIQUE: Multidetector CT imaging of the abdomen and pelvis was performed using the standard protocol following bolus administration of intravenous contrast. CONTRAST:  OMNIPAQUE IOHEXOL 350 MG/ML SOLN COMPARISON:  None. FINDINGS: Lower chest: No acute abnormality. Hepatobiliary: No focal liver abnormality is seen. No gallstones, gallbladder wall thickening, or biliary dilatation. Pancreas: Unremarkable. No pancreatic ductal dilatation or surrounding inflammatory changes. Spleen: Normal in size without focal abnormality. Adrenals/Urinary Tract: Adrenal glands appear normal. Small nonobstructive left renal calculus is noted. No hydronephrosis or renal obstruction is noted. Urinary bladder is unremarkable. Stomach/Bowel: Stomach is within normal limits. Appendix appears normal. No evidence of bowel wall thickening, distention, or inflammatory changes. Vascular/Lymphatic: Aortic atherosclerosis. No enlarged abdominal or pelvic lymph nodes. Reproductive: The uterus is not clearly visualized. 7.7 x 3.4 cm complex oval-shaped abnormality is seen in the right adnexal region concerning for complex  right ovarian or adnexal mass. Left ovary is unremarkable. Other: No hernia is noted. However, minimal amount of free fluid is noted around the liver and spleen with a mild amount of fluid seen in the pelvis. Musculoskeletal: No acute or significant osseous findings. IMPRESSION: 1. 7.7 x 3.4 cm complex oval-shaped abnormality seen in the right adnexal region concerning for complex right ovarian or adnexal mass. Pelvic ultrasound is recommended for further evaluation. 2. Small nonobstructive left renal calculus. No hydronephrosis or renal obstruction is noted. 3. Mild amount of free fluid is noted in the pelvis, with minimal fluid noted around the spleen and liver. Aortic Atherosclerosis (ICD10-I70.0). Electronically Signed   By: Lupita Raider M.D.   On: 07/17/2019 16:35   US PELVIC COMPLETE W TRANSVAGINAL AND TORSION R/O  Result Date: 07/17/2019 CLINICAL DATA:  Right lower quadrant abdominal pain. Adnexal mass on CT. EXAM: TRANSABDOMINAL AND TRANSVAGINAL ULTRASOUND OF PELVIS DOPPLER ULTRASOUND OF OVARIES TECHNIQUE: Both transabdominal and transvaginal ultrasound examinations of the pelvis were performed. Transabdominal technique was performed for global imaging of the pelvis including uterus, ovaries, adnexal regions, and pelvic cul-de-sac. It was necessary to proceed with endovaginal exam following the transabdominal exam to visualize the ovaries and adnexa. Color and duplex Doppler ultrasound was utilized to evaluate blood flow to the ovaries. COMPARISON:  CT earlier this day FINDINGS: Uterus Surgically absent. Right ovary Measurements: 8.2 x 5.6 x 8.3 cm = volume: 200 mL. The ovary is enlarged and heterogeneously echogenic slightly bilobed in shape. Few peripheral follicles are seen. Blood flow is noted with both ovarian and venous flow. Left ovary Measurements: 4.0 x 2.5 x 2.7 cm = volume: 13.9 mL. Follicular cyst measures 2.0 cm. Additional physiologic follicles. Normal blood flow is seen. Pulsed Doppler  evaluation of both ovaries demonstrates normal low-resistance arterial and venous waveforms. Other findings Small to moderate free fluid is seen in the right adnexa and pelvic cul-de-sac. IMPRESSION: 1. Enlarged heterogeneous echogenic right ovary measuring 8.2 x 5.6 x 8.3 cm. Blood flow is noted with demonstration of both arterial and venous waveforms. There is adjacent free fluid in the right adnexa and pelvic cul-de-sac. Milder is no torsion at this time, the possibility of intermittent torsion is raised given the ovarian size and adjacent fluid. 2. Recommend gyn consultation. There is no cystic mass in the ovary,  and no discrete solid mass other than heterogeneous ovarian enlargement. If further imaging characterization is desired, recommend pelvic MRI. Given patient's relatively recent hysterectomy 4 months ago, correlation with operative report for comment on the right ovary may be helpful. 3. Physiologic follicular cyst in the left ovary with normal blood flow. Electronically Signed   By: Narda Rutherford M.D.   On: 07/17/2019 19:14    Procedures Procedures (including critical care time)  Medications Ordered in ED Medications  sodium chloride 0.9 % bolus 1,000 mL (1,000 mLs Intravenous New Bag/Given 07/19/19 1214)  ondansetron (ZOFRAN) injection 4 mg (4 mg Intravenous Given 07/19/19 1217)  HYDROmorphone (DILAUDID) injection 0.5 mg (0.5 mg Intravenous Given 07/19/19 1217)    ED Course  I have reviewed the triage vital signs and the nursing notes.  Pertinent labs & imaging results that were available during my care of the patient were reviewed by me and considered in my medical decision making (see chart for details).  39 year old female presents with ongoing RLQ abdominal pain after being diagnosed with an enlarged R ovary on [place]. Her vitals are normal. She is still significantly tender in the RLQ. Will repeat blood work and plan on talking with OBGYN  Labs are grossly unremarkable. Discussed  with Dr. Jolayne Panther with OBGYN - she states that this is not a surgical problem since there is no cyst. The ovary just appears enlarged. She recommends making sure pt is taking scheduled Ibuprofen 600mg  every 6 hours whether she has pain or not. I discussed this with the patient. She immediately became anxious and started crying. Her pain is controlled at this time at 2/10 but she doesn't want to be on scheduled pain medicine at home. She was offered admission for pain control but declined. Will rx PO Dilaudid and advised scheduled Ibuprofen. Advised OBGYN f/u   MDM Rules/Calculators/A&P   Final Clinical Impression(s) / ED Diagnoses Final diagnoses:  Enlarged ovary  RLQ abdominal pain    Rx / DC Orders ED Discharge Orders    None       4/10, PA-C 07/19/19 1521    07/21/19, MD 07/19/19 [phone]

## 2019-07-19 NOTE — Discharge Instructions (Signed)
Stop Percocet. You can start Dilaudid every 4 hours as needed for severe pain Continue Ibuprofen 600mg  every 6 hours for pain and inflammation Please follow up with your OBGYN on Monday

## 2019-08-12 DIAGNOSIS — Z90721 Acquired absence of ovaries, unilateral: Secondary | ICD-10-CM | POA: Insufficient documentation

## 2019-09-17 DIAGNOSIS — E538 Deficiency of other specified B group vitamins: Secondary | ICD-10-CM | POA: Insufficient documentation

## 2019-11-19 DIAGNOSIS — N83202 Unspecified ovarian cyst, left side: Secondary | ICD-10-CM | POA: Diagnosis present

## 2020-02-05 ENCOUNTER — Emergency Department (HOSPITAL_COMMUNITY): Payer: 59

## 2020-02-05 ENCOUNTER — Encounter (HOSPITAL_COMMUNITY): Payer: Self-pay

## 2020-02-05 ENCOUNTER — Observation Stay (HOSPITAL_COMMUNITY)
Admission: EM | Admit: 2020-02-05 | Discharge: 2020-02-06 | Disposition: A | Discharge status: Home / Self Care | Payer: 59 | Attending: Family Medicine | Admitting: Family Medicine

## 2020-02-05 ENCOUNTER — Other Ambulatory Visit: Payer: Self-pay | Admitting: Obstetrics and Gynecology

## 2020-02-05 ENCOUNTER — Other Ambulatory Visit: Payer: Self-pay

## 2020-02-05 DIAGNOSIS — R1032 Left lower quadrant pain: Secondary | ICD-10-CM | POA: Diagnosis present

## 2020-02-05 DIAGNOSIS — R109 Unspecified abdominal pain: Secondary | ICD-10-CM

## 2020-02-05 DIAGNOSIS — N83519 Torsion of ovary and ovarian pedicle, unspecified side: Secondary | ICD-10-CM

## 2020-02-05 DIAGNOSIS — Z91040 Latex allergy status: Secondary | ICD-10-CM | POA: Insufficient documentation

## 2020-02-05 DIAGNOSIS — Z87891 Personal history of nicotine dependence: Secondary | ICD-10-CM | POA: Insufficient documentation

## 2020-02-05 DIAGNOSIS — N83512 Torsion of left ovary and ovarian pedicle: Principal | ICD-10-CM | POA: Insufficient documentation

## 2020-02-05 DIAGNOSIS — K219 Gastro-esophageal reflux disease without esophagitis: Secondary | ICD-10-CM | POA: Diagnosis not present

## 2020-02-05 DIAGNOSIS — Z20822 Contact with and (suspected) exposure to covid-19: Secondary | ICD-10-CM | POA: Diagnosis not present

## 2020-02-05 DIAGNOSIS — N83202 Unspecified ovarian cyst, left side: Secondary | ICD-10-CM | POA: Diagnosis present

## 2020-02-05 LAB — URINALYSIS, ROUTINE W REFLEX MICROSCOPIC
Bilirubin Urine: NEGATIVE
Glucose, UA: NEGATIVE mg/dL
Hgb urine dipstick: NEGATIVE
Ketones, ur: 20 mg/dL — AB
Leukocytes,Ua: NEGATIVE
Nitrite: NEGATIVE
Protein, ur: NEGATIVE mg/dL
Specific Gravity, Urine: 1.012 (ref 1.005–1.030)
pH: 6 (ref 5.0–8.0)

## 2020-02-05 LAB — COMPREHENSIVE METABOLIC PANEL
ALT: 11 U/L (ref 0–44)
AST: 17 U/L (ref 15–41)
Albumin: 4.9 g/dL (ref 3.5–5.0)
Alkaline Phosphatase: 58 U/L (ref 38–126)
Anion gap: 12 (ref 5–15)
BUN: 9 mg/dL (ref 6–20)
CO2: 26 mmol/L (ref 22–32)
Calcium: 9.4 mg/dL (ref 8.9–10.3)
Chloride: 101 mmol/L (ref 98–111)
Creatinine, Ser: 0.67 mg/dL (ref 0.44–1.00)
GFR, Estimated: >60 mL/min (ref >60)
Glucose, Bld: 121 mg/dL — ABNORMAL HIGH (ref 70–99)
Potassium: 3.9 mmol/L (ref 3.5–5.1)
Sodium: 139 mmol/L (ref 135–145)
Total Bilirubin: 0.9 mg/dL (ref 0.3–1.2)
Total Protein: 8 g/dL (ref 6.5–8.1)

## 2020-02-05 LAB — CBC
HCT: 37.2 % (ref 36.0–46.0)
Hemoglobin: 12.2 g/dL (ref 12.0–15.0)
MCH: 31 pg (ref 26.0–34.0)
MCHC: 32.8 g/dL (ref 30.0–36.0)
MCV: 94.7 fL (ref 80.0–100.0)
Platelets: 325 10*3/uL (ref 150–400)
RBC: 3.93 MIL/uL (ref 3.87–5.11)
RDW: 12.3 % (ref 11.5–15.5)
WBC: 14.5 10*3/uL — ABNORMAL HIGH (ref 4.0–10.5)
nRBC: 0 % (ref 0.0–0.2)

## 2020-02-05 LAB — RESP PANEL BY RT-PCR (FLU A&B, COVID) ARPGX2
Influenza A by PCR: NEGATIVE
Influenza B by PCR: NEGATIVE
SARS Coronavirus 2 by RT PCR: NEGATIVE

## 2020-02-05 LAB — WET PREP, GENITAL
Clue Cells Wet Prep HPF POC: NONE SEEN
Sperm: NONE SEEN
Trich, Wet Prep: NONE SEEN
WBC, Wet Prep HPF POC: NONE SEEN
Yeast Wet Prep HPF POC: NONE SEEN

## 2020-02-05 LAB — LIPASE, BLOOD: Lipase: 16 U/L (ref 11–51)

## 2020-02-05 MED ORDER — MORPHINE SULFATE (PF) 4 MG/ML IV SOLN
4.0000 mg | Freq: Once | INTRAVENOUS | Status: AC
  Administered 2020-02-05: 4 mg via INTRAVENOUS
  Filled 2020-02-05: qty 1

## 2020-02-05 MED ORDER — HYDROCHLOROTHIAZIDE 12.5 MG PO CAPS
12.5000 mg | ORAL_CAPSULE | Freq: Every day | ORAL | Status: DC
  Filled 2020-02-05: qty 1

## 2020-02-05 MED ORDER — BUPROPION HCL ER (XL) 150 MG PO TB24
300.0000 mg | ORAL_TABLET | Freq: Every day | ORAL | Status: DC
  Filled 2020-02-05: qty 2

## 2020-02-05 MED ORDER — HYDROMORPHONE HCL 1 MG/ML IJ SOLN
0.2000 mg | INTRAMUSCULAR | Status: DC | PRN
  Administered 2020-02-05 – 2020-02-06 (×4): 0.6 mg via INTRAVENOUS
  Filled 2020-02-05 (×4): qty 1

## 2020-02-05 MED ORDER — SODIUM CHLORIDE 0.9 % IV BOLUS
1000.0000 mL | Freq: Once | INTRAVENOUS | Status: AC
  Administered 2020-02-05: 1000 mL via INTRAVENOUS

## 2020-02-05 MED ORDER — MENTHOL 3 MG MT LOZG: 1.0000 | LOZENGE | OROMUCOSAL | Status: DC | PRN

## 2020-02-05 MED ORDER — CLONAZEPAM 0.5 MG PO TABS: 0.5000 mg | ORAL_TABLET | Freq: Every day | ORAL | Status: DC | PRN

## 2020-02-05 MED ORDER — HYDROXYZINE HCL 10 MG PO TABS
10.0000 mg | ORAL_TABLET | Freq: Three times a day (TID) | ORAL | Status: DC | PRN
  Filled 2020-02-05: qty 1

## 2020-02-05 MED ORDER — ONDANSETRON HCL 4 MG/2ML IJ SOLN
4.0000 mg | Freq: Once | INTRAMUSCULAR | Status: AC
  Administered 2020-02-05: 4 mg via INTRAVENOUS
  Filled 2020-02-05: qty 2

## 2020-02-05 MED ORDER — BOOST / RESOURCE BREEZE PO LIQD CUSTOM: 1.0000 | Freq: Three times a day (TID) | ORAL | Status: DC

## 2020-02-05 MED ORDER — DEXTROSE IN LACTATED RINGERS 5 % IV SOLN: INTRAVENOUS | Status: DC

## 2020-02-05 MED ORDER — GUAIFENESIN 100 MG/5ML PO SOLN
15.0000 mL | ORAL | Status: DC | PRN
Start: 2020-02-05 — End: 2020-02-07
  Filled 2020-02-05: qty 15

## 2020-02-05 MED ORDER — ALUM & MAG HYDROXIDE-SIMETH 200-200-20 MG/5ML PO SUSP: 30.0000 mL | ORAL | Status: DC | PRN

## 2020-02-05 MED ORDER — ESCITALOPRAM OXALATE 20 MG PO TABS: 20.0000 mg | ORAL_TABLET | Freq: Every day | ORAL | Status: DC

## 2020-02-05 MED ORDER — VITAMIN B-12 1000 MCG PO TABS
1000.0000 ug | ORAL_TABLET | Freq: Every day | ORAL | Status: DC
  Filled 2020-02-05: qty 1

## 2020-02-05 NOTE — ED Notes (Signed)
[facility] picking up patient and taking them to Terri Carter

## 2020-02-05 NOTE — ED Notes (Signed)
Report given to [facility]

## 2020-02-05 NOTE — H&P (Signed)
Terri Carter is an 39 y.o. No obstetric history on file. female.   Chief Complaint: left sided abdominal pain HPI: Patient with h/o hysterectomy and RSO in 06/2019 following ovarian torsion. Seen in hospital with findings of no torsion, and taken to the OR where torsion was discovered about a week later and ovary could not be saved. Has h/o cyst on left. Prior gynecologist left it alone. Pain is very similar, began on standing today. + N/V, + diaphoresis. Feels some better due to Morphine given in ED. Last had applesauce in [facility] ED.  Past Medical History:  Diagnosis Date  . Anxiety   . GERD (gastroesophageal reflux disease)   . Headache    cluster  . Voice hoarseness last several months    Past Surgical History:  Procedure Laterality Date  . ABDOMINAL HYSTERECTOMY    . BRAVO PH STUDY N/A 06/23/2014   Procedure: BRAVO PH STUDY;  Surgeon: Charlott Rakes, MD;  Location: [facility];  Service: Endoscopy;  Laterality: N/A;  . 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 ESOPHAGOGASTRODUODENOSCOPY (EGD) WITH PROPOFOL N/A 06/23/2014   Procedure: ESOPHAGOGASTRODUODENOSCOPY (EGD) WITH PROPOFOL;  Surgeon: Charlott Rakes, MD;  Location: [facility];  Service: Endoscopy;  Laterality: N/A;  . wistom teeth extraction  8 years ago  . 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 Pathology showed no residual dysplasia (had CKC prior to hysterectomy)    History reviewed. No pertinent family history. Social History:  reports that she has quit smoking. Her smoking use included cigars. She has never used smokeless tobacco. She reports previous alcohol use. She reports previous drug use. Drug: Marijuana.  Allergies:  Allergies  Allergen Reactions  . Buspirone Other (See Comments)  . Desvenlafaxine Other (See Comments)  .  Clindamycin/Lincomycin Other (See Comments)    Vaginal infection  . Latex Other (See Comments)    Pulls skin off when she wears it     Medications Prior to Admission  Medication Sig Dispense Refill  . acetaminophen (TYLENOL) 500 MG tablet Take 1,000 mg by mouth every 6 (six) hours as needed for mild pain.    Marland Kitchen buPROPion (WELLBUTRIN XL) 300 MG 24 hr tablet Take 300 mg by mouth at bedtime.     . clonazePAM (KLONOPIN) 0.5 MG tablet Take 0.5 mg by mouth daily as needed for sleep.    . cyanocobalamin 1000 MCG tablet Take 1,000 mcg by mouth daily.    Marland Kitchen escitalopram (LEXAPRO) 20 MG tablet Take 20 mg by mouth daily.    . hydrochlorothiazide (MICROZIDE) 12.5 MG capsule Take 12.5 mg by mouth at bedtime.     Marland Kitchen HYDROmorphone (DILAUDID) 4 MG tablet Take 1 tablet (4 mg total) by mouth every 4 (four) hours as needed for severe pain. 20 tablet 0  . hydrOXYzine (ATARAX/VISTARIL) 10 MG tablet Take 10 mg by mouth as needed for anxiety.    Marland Kitchen ibuprofen (ADVIL,MOTRIN) 200 MG tablet Take 400-800 mg by mouth every 6 (six) hours as needed for moderate pain.     . Vitamin D, Ergocalciferol, (DRISDOL) 1.25 MG (50000 UNIT) CAPS capsule Take 50,000 Units by mouth once a week.    . ondansetron (ZOFRAN ODT) 4 MG disintegrating tablet Take 1 tablet (4 mg total) by mouth every 8 (eight) hours as needed for nausea or vomiting. (Patient not taking: No sig reported) 20  tablet 0    Pertinent items are noted in HPI.  Blood pressure 125/75, pulse 91, temperature 98.3 F (36.8 C), temperature source Oral, resp. rate 18, height 5\' 7"  (1.702 m), weight 77.1 kg, last menstrual period 05/19/2014, SpO2 100 %. General appearance: alert, cooperative and appears stated age Head: Normocephalic, without obvious abnormality, atraumatic Neck: supple, symmetrical, trachea midline Lungs: normal effort Heart: regular rate and rhythm Abdomen: soft, tender on LLQ, + guarding, no rebound Extremities: Homans sign is negative, no sign of  DVT Skin: Skin color, texture, turgor normal. No rashes or lesions Neurologic: Grossly normal   Lab Results  Component Value Date   WBC 14.5 (H) 02/05/2020   HGB 12.2 02/05/2020   HCT 37.2 02/05/2020   MCV 94.7 02/05/2020   PLT 325 02/05/2020   Lab Results  Component Value Date   HCG <5.0 07/17/2019     Assessment/Plan Principal Problem:   Cyst of left ovary Active Problems:   Abdominal pain  Check MRI Clear liquids Pain control Possible OR in am if MRI shows torsion, or pain fails to improve.    07/19/2019 02/05/2020, 10:55 PM

## 2020-02-05 NOTE — ED Provider Notes (Signed)
[facility]-EMERGENCY DEPT Provider Note   CSN: [id number] Arrival date & time: 02/05/20  1016     History Chief Complaint  Patient presents with  . Emesis  . Abdominal Pain    Terri Carter is a 39 y.o. female with a past medical history of GERD, status post hysterectomy and right oophorectomy/salpingectomy presenting to the ED with a chief complaint of left lower quadrant abdominal pain.  Symptoms began last night.  She has had several episodes of nonbloody, nonbilious emesis this morning.  She took 1 dose of p.o. Dilaudid earlier this morning but had an episode of emesis immediately afterwards.  She denies any vaginal discharge, abnormal vaginal bleeding or concern for STDs.  No sick contacts with similar symptoms or suspicious food intake.  Denies any urinary symptoms, diarrhea, fever.  HPI     Past Medical History:  Diagnosis Date  . Anxiety   . GERD (gastroesophageal reflux disease)   . Headache    cluster  . Voice hoarseness last several months    Patient Active Problem List   Diagnosis Date Noted  . Hoarseness 06/23/2014  . GERD (gastroesophageal reflux disease) 06/23/2014    Past Surgical History:  Procedure Laterality Date  . ABDOMINAL HYSTERECTOMY    . BRAVO PH STUDY N/A 06/23/2014   Procedure: BRAVO PH STUDY;  Surgeon: Charlott Rakes, MD;  Location: [facility];  Service: Endoscopy;  Laterality: N/A;  . 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 ESOPHAGOGASTRODUODENOSCOPY (EGD) WITH PROPOFOL N/A 06/23/2014   Procedure: ESOPHAGOGASTRODUODENOSCOPY (EGD) WITH PROPOFOL;  Surgeon: Charlott Rakes, MD;  Location: [facility];  Service: Endoscopy;  Laterality: N/A;  . wistom teeth extraction  8 years ago  . 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 Pathology showed no  residual dysplasia (had CKC prior to hysterectomy)     OB History   No obstetric history on file.     History reviewed. No pertinent family history.  Social History   Tobacco Use  . Smoking status: Former Smoker    Types: Cigars  . Smokeless tobacco: Never Used  . Tobacco comment: ciagarettes quit 2010 smoked one half to 1 ppd for 8 years now amokes cigars   Vaping Use  . Vaping Use: Some days  . Substances: Mixture of cannabinoids  Substance Use Topics  . Alcohol use: Not Currently    Comment: occasional wine  . Drug use: Not Currently    Types: Marijuana    Comment: occasional marijuana use    Home Medications Prior to Admission medications   Medication Sig Start Date End Date Taking? Authorizing Provider  acetaminophen (TYLENOL) 500 MG tablet Take 1,000 mg by mouth every 6 (six) hours as needed for mild pain.   Yes [provider]  buPROPion (WELLBUTRIN XL) 300 MG 24 hr tablet Take 300 mg by mouth at bedtime.    Yes [provider]  clonazePAM (KLONOPIN) 0.5 MG tablet Take 0.5 mg by mouth daily as needed for sleep. 04/04/19  Yes [provider]  cyanocobalamin 1000 MCG tablet Take 1,000 mcg by mouth daily. 11/19/19  Yes [provider]  escitalopram (LEXAPRO) 20 MG tablet Take 20 mg by mouth daily. 05/19/19  Yes [provider]  hydrochlorothiazide (MICROZIDE) 12.5 MG capsule Take 12.5 mg by mouth at bedtime.  07/02/19  Yes [provider]  HYDROmorphone (DILAUDID) 4 MG tablet Take 1 tablet (4 mg total) by mouth every 4 (four) hours as needed for severe pain. 07/19/19  Yes Bethel Born, PA-C  hydrOXYzine (ATARAX/VISTARIL) 10 MG tablet Take 10 mg by mouth as needed for anxiety. 02/03/20  Yes [provider]  ibuprofen (ADVIL,MOTRIN) 200 MG tablet Take 400-800 mg by mouth every 6 (six) hours as needed for moderate pain.    Yes [provider]  Vitamin D, Ergocalciferol, (DRISDOL) 1.25 MG (50000 UNIT) CAPS  capsule Take 50,000 Units by mouth once a week. 08/19/19  Yes [provider]  ondansetron (ZOFRAN ODT) 4 MG disintegrating tablet Take 1 tablet (4 mg total) by mouth every 8 (eight) hours as needed for nausea or vomiting. Patient not taking: No sig reported 07/17/19   Gailen Shelter, PA    Allergies    Buspirone, Desvenlafaxine, Clindamycin/lincomycin, and Latex  Review of Systems   Review of Systems  Constitutional: Negative for appetite change, chills and fever.  HENT: Negative for ear pain, rhinorrhea, sneezing and sore throat.   Eyes: Negative for photophobia and visual disturbance.  Respiratory: Negative for cough, chest tightness, shortness of breath and wheezing.   Cardiovascular: Negative for chest pain and palpitations.  Gastrointestinal: Positive for abdominal pain, nausea and vomiting. Negative for blood in stool, constipation and diarrhea.  Genitourinary: Positive for pelvic pain. Negative for dysuria, hematuria and urgency.  Musculoskeletal: Negative for myalgias.  Skin: Negative for rash.  Neurological: Negative for dizziness, weakness and light-headedness.    Physical Exam Updated Vital Signs BP (!) 106/59   Pulse 100   Temp 98.1 F (36.7 C) (Oral)   Resp 18   Ht  (1.702 m)   Wt 77.1 kg   LMP 05/19/2014   SpO2 100%   BMI 26.63 kg/m   Physical Exam Vitals and nursing note reviewed. Exam conducted with a chaperone present.  Constitutional:      General: She is not in acute distress.    Appearance: She is well-developed and well-nourished.  HENT:     Head: Normocephalic and atraumatic.     Nose: Nose normal.  Eyes:     General: No scleral icterus.       Left eye: No discharge.     Extraocular Movements: EOM normal.     Conjunctiva/sclera: Conjunctivae normal.  Cardiovascular:     Rate and Rhythm: Normal rate and regular rhythm.     Pulses: Intact distal pulses.     Heart sounds: Normal heart sounds. No murmur heard. No friction rub. No  gallop.   Pulmonary:     Effort: Pulmonary effort is normal. No respiratory distress.     Breath sounds: Normal breath sounds.  Abdominal:     General: Bowel sounds are normal. There is no distension.     Palpations: Abdomen is soft.     Tenderness: There is abdominal tenderness in the left lower quadrant. There is no guarding.  Genitourinary:    Vagina: Vaginal discharge present.     Cervix: No cervical motion tenderness.     Uterus: Not tender.      Adnexa:        Right: No tenderness.         Left: Tenderness present.      Comments: Pelvic exam: normal external genitalia without evidence of trauma. VULVA: normal appearing vulva with no masses, tenderness or lesion. VAGINA: normal appearing vagina with normal color and discharge, no lesions. CERVIX: normal appearing  cervix without lesions, cervical motion tenderness absent, cervical os closed with out purulent discharge; No vaginal discharge. Wet prep and DNA probe for chlamydia and GC obtained.   ADNEXA: normal adnexa in size, some L sided tenderness and no masses UTERUS: uterus is normal size, shape, consistency and nontender.   Musculoskeletal:        General: No edema. Normal range of motion.     Cervical back: Normal range of motion and neck supple.  Skin:    General: Skin is warm and dry.     Findings: No rash.  Neurological:     Mental Status: She is alert.     Motor: No abnormal muscle tone.     Coordination: Coordination normal.  Psychiatric:        Mood and Affect: Mood and affect normal.     ED Results / Procedures / Treatments   Labs (all labs ordered are listed, but only abnormal results are displayed) Labs Reviewed  COMPREHENSIVE METABOLIC PANEL - Abnormal; Notable for the following components:      Result Value   Glucose, Bld 121 (*)    All other components within normal limits  CBC - Abnormal; Notable for the following components:   WBC 14.5 (*)    All other components within normal limits  WET PREP,  GENITAL  RESP PANEL BY RT-PCR (FLU A&B, COVID) ARPGX2  LIPASE, BLOOD  URINALYSIS, ROUTINE W REFLEX MICROSCOPIC  GC/CHLAMYDIA PROBE AMP ([place]) NOT AT [facility]    EKG None  Radiology US Transvaginal Non-OB  Result Date: 02/05/2020 CLINICAL DATA:  Left lower quadrant pain. History of hysterectomy and right oophorectomy EXAM: TRANSABDOMINAL AND TRANSVAGINAL ULTRASOUND OF PELVIS DOPPLER ULTRASOUND OF OVARIES TECHNIQUE: Both transabdominal and transvaginal ultrasound examinations of the pelvis were performed. Transabdominal technique was performed for global imaging of the pelvis including uterus, ovaries, adnexal regions, and pelvic cul-de-sac. It was necessary to proceed with endovaginal exam following the transabdominal exam to visualize the left ovary. Color and duplex Doppler ultrasound was utilized to evaluate blood flow to the ovaries. COMPARISON:  07/17/2019 FINDINGS: Uterus Surgically absent. Right ovary Surgically absent. Left ovary Measurements: 7.5 x 3.1 x 5.5 cm = volume: 67 mL. Left ovary is abnormally enlarged and heterogeneous. Complex left ovarian cyst measuring 2.1 cm. Additional 1.9 cm simple cyst or follicle. Pulsed Doppler evaluation of the left ovary demonstrates low-resistance arterial and venous waveforms. Other findings No abnormal free fluid. IMPRESSION: 1. Abnormally enlarged, heterogeneous appearance of the left ovary. Although both arterial and venous waveforms were detected within the left ovary, the appearance raises the suspicion for partial/intermittent torsion. Recommend surgical evaluation. 2. Status post hysterectomy and right oophorectomy. These results were called by telephone at the time of interpretation on 02/05/2020 at 4:48 pm to provider Dr Jeraldine LootsLockwood, who verbally acknowledged these results. Electronically Signed   By: Duanne GuessNicholas  Plundo D.O.   On: 02/05/2020 16:48   US Pelvis Complete  Result Date: 02/05/2020 CLINICAL DATA:  Left lower quadrant pain. History of  hysterectomy and right oophorectomy EXAM: TRANSABDOMINAL AND TRANSVAGINAL ULTRASOUND OF PELVIS DOPPLER ULTRASOUND OF OVARIES TECHNIQUE: Both transabdominal and transvaginal ultrasound examinations of the pelvis were performed. Transabdominal technique was performed for global imaging of the pelvis including uterus, ovaries, adnexal regions, and pelvic cul-de-sac. It was necessary to proceed with endovaginal exam following the transabdominal exam to visualize the left ovary. Color and duplex Doppler ultrasound was utilized to evaluate blood flow to the ovaries. COMPARISON:  07/17/2019 FINDINGS: Uterus Surgically absent. Right  ovary Surgically absent. Left ovary Measurements: 7.5 x 3.1 x 5.5 cm = volume: 67 mL. Left ovary is abnormally enlarged and heterogeneous. Complex left ovarian cyst measuring 2.1 cm. Additional 1.9 cm simple cyst or follicle. Pulsed Doppler evaluation of the left ovary demonstrates low-resistance arterial and venous waveforms. Other findings No abnormal free fluid. IMPRESSION: 1. Abnormally enlarged, heterogeneous appearance of the left ovary. Although both arterial and venous waveforms were detected within the left ovary, the appearance raises the suspicion for partial/intermittent torsion. Recommend surgical evaluation. 2. Status post hysterectomy and right oophorectomy. These results were called by telephone at the time of interpretation on 02/05/2020 at 4:48 pm to provider Dr Jeraldine Loots, who verbally acknowledged these results. Electronically Signed   By: Duanne Guess D.O.   On: 02/05/2020 16:48   [place] Art/Ven Flow Abd Pelv Doppler  Result Date: 02/05/2020 CLINICAL DATA:  Left lower quadrant pain. History of hysterectomy and right oophorectomy EXAM: TRANSABDOMINAL AND TRANSVAGINAL ULTRASOUND OF PELVIS DOPPLER ULTRASOUND OF OVARIES TECHNIQUE: Both transabdominal and transvaginal ultrasound examinations of the pelvis were performed. Transabdominal technique was performed for global imaging  of the pelvis including uterus, ovaries, adnexal regions, and pelvic cul-de-sac. It was necessary to proceed with endovaginal exam following the transabdominal exam to visualize the left ovary. Color and duplex Doppler ultrasound was utilized to evaluate blood flow to the ovaries. COMPARISON:  07/17/2019 FINDINGS: Uterus Surgically absent. Right ovary Surgically absent. Left ovary Measurements: 7.5 x 3.1 x 5.5 cm = volume: 67 mL. Left ovary is abnormally enlarged and heterogeneous. Complex left ovarian cyst measuring 2.1 cm. Additional 1.9 cm simple cyst or follicle. Pulsed Doppler evaluation of the left ovary demonstrates low-resistance arterial and venous waveforms. Other findings No abnormal free fluid. IMPRESSION: 1. Abnormally enlarged, heterogeneous appearance of the left ovary. Although both arterial and venous waveforms were detected within the left ovary, the appearance raises the suspicion for partial/intermittent torsion. Recommend surgical evaluation. 2. Status post hysterectomy and right oophorectomy. These results were called by telephone at the time of interpretation on 02/05/2020 at 4:48 pm to provider Dr Jeraldine Loots, who verbally acknowledged these results. Electronically Signed   By: Duanne Guess D.O.   On: 02/05/2020 16:48    Procedures Procedures (including critical care time)  Medications Ordered in ED Medications  sodium chloride 0.9 % bolus 1,000 mL (0 mLs Intravenous Stopped 02/05/20 1725)  morphine 4 MG/ML injection 4 mg (4 mg Intravenous Given 02/05/20 1612)  ondansetron (ZOFRAN) injection 4 mg (4 mg Intravenous Given 02/05/20 1611)    ED Course  I have reviewed the triage vital signs and the nursing notes.  Pertinent labs & imaging results that were available during my care of the patient were reviewed by me and considered in my medical decision making (see chart for details).  Clinical Course as of 02/05/20 1743  Thu Feb 05, 2020  1610 WBC(!): 14.5 [HK]  1615 Spoke to  Dr. Ladean Raya, on-call for [facility]. She recommends outpatient follow-up with her OB/GYN as soon as possible.  She reviewed her ultrasound and states that she does have a large cyst present which could be causing her pain.  There is good blood flow to the ovary, she does not feel that she needs to be transferred for emergent surgery at this time. [HK]  1717 Reconsulted OB/GYN on-call, Dr. Shawnie Pons.  She reviewed patient's finding and I told her her concerns about her history.  She accepts the patient in transfer for overnight observation for reimaging in the morning. [HK]  Clinical Course User Index [HK] Dietrich Pates, PA-C   MDM Rules/Calculators/A&P                          39 year old female with a past medical history of hysterectomy and right oophorectomy/salpingectomy presenting to the ED for left lower quadrant pain that began about 24 hours ago.  She states that this feels similar to her prior ovarian torsion on her right side.  There are some tenderness of left lower quadrant without rebound or guarding.  Pelvic exam with left adnexal tenderness without other abnormalities.  Work appear significant for leukocytosis of 14 which I feel is reactive from her symptoms.  CMP and lipase unremarkable.  Wet prep pending.  Pelvic ultrasound shows large left-sided ovarian cyst with findings concerning for possible intermittent torsion.  There is good blood flow to the ovary during the time of ultrasound.  After speaking to patient and OB/GYN on call over at [facility] feel that will be best for her to be transferred there for overnight observation and potential reimaging in the morning to evaluate if her symptoms have worsened.  Patient is extremely concerned due to her history of right ovarian torsion at this is the same thing that is happening to her.  She is stable at the time of transfer   Portions of this note were generated with Scientist, clinical (histocompatibility and immunogenetics). Dictation errors may occur  despite best attempts at proofreading.  Final Clinical Impression(s) / ED Diagnoses Final diagnoses:  Left ovarian cyst    Rx / DC Orders ED Discharge Orders    None       Dietrich Pates, PA-C 02/05/20 1744    Gerhard Munch, MD 02/05/20 2332

## 2020-02-05 NOTE — ED Notes (Signed)
Pelvic cart set up at bedside  

## 2020-02-05 NOTE — ED Notes (Signed)
attempted to call report Terri Carter states she will have the nurse call me back

## 2020-02-05 NOTE — ED Triage Notes (Signed)
Patient c/o LLQ pain and emesis since last night. Patient reports she has had Ovarian issues.

## 2020-02-05 NOTE — Progress Notes (Signed)
Pt arrived to floor from [facility]. Pt alert and oriented x4. Oriented to room and call bell. Called Dr. Shawnie Pons to notify pt is here for admission.

## 2020-02-06 ENCOUNTER — Encounter (HOSPITAL_COMMUNITY)
Admission: EM | Disposition: A | Discharge status: Home / Self Care | Payer: Self-pay | Source: Home / Self Care | Attending: Emergency Medicine

## 2020-02-06 ENCOUNTER — Observation Stay (HOSPITAL_COMMUNITY): Payer: 59 | Admitting: Anesthesiology

## 2020-02-06 ENCOUNTER — Observation Stay (HOSPITAL_COMMUNITY): Payer: 59

## 2020-02-06 ENCOUNTER — Encounter (HOSPITAL_COMMUNITY): Payer: Self-pay | Admitting: Family Medicine

## 2020-02-06 DIAGNOSIS — N83519 Torsion of ovary and ovarian pedicle, unspecified side: Secondary | ICD-10-CM

## 2020-02-06 DIAGNOSIS — N83512 Torsion of left ovary and ovarian pedicle: Secondary | ICD-10-CM

## 2020-02-06 HISTORY — PX: LAPAROSCOPY: SHX197

## 2020-02-06 LAB — TYPE AND SCREEN
ABO/RH(D): A POS
Antibody Screen: NEGATIVE

## 2020-02-06 LAB — GC/CHLAMYDIA PROBE AMP (~~LOC~~) NOT AT ARMC
Chlamydia: NEGATIVE
Comment: NEGATIVE
Comment: NORMAL
Neisseria Gonorrhea: NEGATIVE

## 2020-02-06 LAB — SURGICAL PCR SCREEN
MRSA, PCR: NEGATIVE
Staphylococcus aureus: NEGATIVE

## 2020-02-06 LAB — ABO/RH: ABO/RH(D): A POS

## 2020-02-06 SURGERY — LAPAROSCOPY, DIAGNOSTIC
Anesthesia: General

## 2020-02-06 MED ORDER — CHLORHEXIDINE GLUCONATE 0.12 % MT SOLN: 15.0000 mL | Freq: Once | OROMUCOSAL | Status: AC

## 2020-02-06 MED ORDER — FENTANYL CITRATE (PF) 250 MCG/5ML IJ SOLN
INTRAMUSCULAR | Status: AC
  Filled 2020-02-06: qty 5

## 2020-02-06 MED ORDER — ADULT MULTIVITAMIN W/MINERALS CH: 1.0000 | ORAL_TABLET | Freq: Every day | ORAL | Status: DC

## 2020-02-06 MED ORDER — FENTANYL CITRATE (PF) 100 MCG/2ML IJ SOLN
INTRAMUSCULAR | Status: AC
  Administered 2020-02-06: 25 ug via INTRAVENOUS
  Filled 2020-02-06: qty 2

## 2020-02-06 MED ORDER — HEMOSTATIC AGENTS (NO CHARGE) OPTIME
TOPICAL | Status: DC | PRN
  Administered 2020-02-06: 1 via TOPICAL

## 2020-02-06 MED ORDER — ONDANSETRON HCL 4 MG/2ML IJ SOLN
INTRAMUSCULAR | Status: DC | PRN
  Administered 2020-02-06: 4 mg via INTRAVENOUS

## 2020-02-06 MED ORDER — AMISULPRIDE (ANTIEMETIC) 5 MG/2ML IV SOLN: 10.0000 mg | Freq: Once | INTRAVENOUS | Status: DC | PRN

## 2020-02-06 MED ORDER — GLYCOPYRROLATE 0.2 MG/ML IJ SOLN
INTRAMUSCULAR | Status: DC | PRN
  Administered 2020-02-06: .2 mg via INTRAVENOUS

## 2020-02-06 MED ORDER — DEXAMETHASONE SODIUM PHOSPHATE 10 MG/ML IJ SOLN
INTRAMUSCULAR | Status: DC | PRN
  Administered 2020-02-06: 8 mg via INTRAVENOUS

## 2020-02-06 MED ORDER — BUPIVACAINE HCL (PF) 0.25 % IJ SOLN
INTRAMUSCULAR | Status: AC
  Filled 2020-02-06: qty 30

## 2020-02-06 MED ORDER — MIDAZOLAM HCL 2 MG/2ML IJ SOLN
INTRAMUSCULAR | Status: DC | PRN
  Administered 2020-02-06: 2 mg via INTRAVENOUS

## 2020-02-06 MED ORDER — OXYCODONE-ACETAMINOPHEN 5-325 MG PO TABS: 1.0000 | ORAL_TABLET | Freq: Four times a day (QID) | ORAL | 0 refills | Status: AC | PRN

## 2020-02-06 MED ORDER — SUGAMMADEX SODIUM 200 MG/2ML IV SOLN
INTRAVENOUS | Status: DC | PRN
  Administered 2020-02-06: 154.2 mg via INTRAVENOUS

## 2020-02-06 MED ORDER — BUPIVACAINE HCL (PF) 0.25 % IJ SOLN
INTRAMUSCULAR | Status: DC | PRN
  Administered 2020-02-06: 20 mL

## 2020-02-06 MED ORDER — SODIUM CHLORIDE 0.9 % IR SOLN
Status: DC | PRN
  Administered 2020-02-06: 1000 mL

## 2020-02-06 MED ORDER — GADOBUTROL 1 MMOL/ML IV SOLN
7.7000 mL | Freq: Once | INTRAVENOUS | Status: AC | PRN
  Administered 2020-02-06: 7.7 mL via INTRAVENOUS

## 2020-02-06 MED ORDER — ROCURONIUM BROMIDE 100 MG/10ML IV SOLN
INTRAVENOUS | Status: DC | PRN
  Administered 2020-02-06: 50 mg via INTRAVENOUS

## 2020-02-06 MED ORDER — LACTATED RINGERS IV SOLN: INTRAVENOUS | Status: DC | PRN

## 2020-02-06 MED ORDER — DOCUSATE SODIUM 100 MG PO CAPS: 100.0000 mg | ORAL_CAPSULE | Freq: Two times a day (BID) | ORAL | 2 refills | Status: AC | PRN

## 2020-02-06 MED ORDER — FENTANYL CITRATE (PF) 100 MCG/2ML IJ SOLN
INTRAMUSCULAR | Status: DC | PRN
  Administered 2020-02-06 (×2): 100 ug via INTRAVENOUS
  Administered 2020-02-06: 150 ug via INTRAVENOUS

## 2020-02-06 MED ORDER — LACTATED RINGERS IV SOLN: INTRAVENOUS | Status: DC

## 2020-02-06 MED ORDER — FENTANYL CITRATE (PF) 100 MCG/2ML IJ SOLN
25.0000 ug | INTRAMUSCULAR | Status: DC | PRN
Start: 2020-02-06 — End: 2020-02-07
  Administered 2020-02-06: 50 ug via INTRAVENOUS

## 2020-02-06 MED ORDER — IBUPROFEN 600 MG PO TABS: 600.0000 mg | ORAL_TABLET | Freq: Four times a day (QID) | ORAL | 3 refills | Status: AC | PRN

## 2020-02-06 MED ORDER — PROPOFOL 10 MG/ML IV BOLUS
INTRAVENOUS | Status: DC | PRN
  Administered 2020-02-06: 200 mg via INTRAVENOUS

## 2020-02-06 MED ORDER — MIDAZOLAM HCL 2 MG/2ML IJ SOLN
INTRAMUSCULAR | Status: AC
  Filled 2020-02-06: qty 2

## 2020-02-06 MED ORDER — PROMETHAZINE HCL 25 MG/ML IJ SOLN
12.5000 mg | Freq: Four times a day (QID) | INTRAMUSCULAR | Status: DC | PRN
  Administered 2020-02-06: 12.5 mg via INTRAVENOUS
  Filled 2020-02-06: qty 1

## 2020-02-06 MED ORDER — ACETAMINOPHEN 500 MG PO TABS
1000.0000 mg | ORAL_TABLET | Freq: Once | ORAL | Status: AC
  Administered 2020-02-06: 1000 mg via ORAL
  Filled 2020-02-06: qty 2

## 2020-02-06 MED ORDER — CHLORHEXIDINE GLUCONATE 0.12 % MT SOLN
OROMUCOSAL | Status: AC
  Administered 2020-02-06: 15 mL via OROMUCOSAL
  Filled 2020-02-06: qty 15

## 2020-02-06 MED ORDER — PROPOFOL 10 MG/ML IV BOLUS
INTRAVENOUS | Status: AC
  Filled 2020-02-06: qty 40

## 2020-02-06 MED ORDER — LIDOCAINE HCL (CARDIAC) PF 100 MG/5ML IV SOSY
PREFILLED_SYRINGE | INTRAVENOUS | Status: DC | PRN
  Administered 2020-02-06: 50 mg via INTRAVENOUS

## 2020-02-06 SURGICAL SUPPLY — 35 items
APPLICATOR ARISTA FLEXITIP XL (MISCELLANEOUS) ×3 IMPLANT
COVER WAND RF STERILE (DRAPES) IMPLANT
DERMABOND ADVANCED (GAUZE/BANDAGES/DRESSINGS) ×2
DERMABOND ADVANCED .7 DNX12 (GAUZE/BANDAGES/DRESSINGS) ×1 IMPLANT
DRSG OPSITE POSTOP 3X4 (GAUZE/BANDAGES/DRESSINGS) IMPLANT
DURAPREP 26ML APPLICATOR (WOUND CARE) ×3 IMPLANT
ELECT REM PT RETURN 9FT ADLT (ELECTROSURGICAL)
ELECTRODE REM PT RTRN 9FT ADLT (ELECTROSURGICAL) IMPLANT
GLOVE BIOGEL PI IND STRL 6.5 (GLOVE) ×2 IMPLANT
GLOVE BIOGEL PI IND STRL 7.0 (GLOVE) ×2 IMPLANT
GLOVE BIOGEL PI INDICATOR 6.5 (GLOVE) ×4
GLOVE BIOGEL PI INDICATOR 7.0 (GLOVE) ×4
GLOVE SURG SS PI 6.5 STRL IVOR (GLOVE) ×3 IMPLANT
GOWN STRL REUS W/ TWL LRG LVL3 (GOWN DISPOSABLE) ×2 IMPLANT
GOWN STRL REUS W/TWL LRG LVL3 (GOWN DISPOSABLE) ×6
HEMOSTAT ARISTA ABSORB 3G PWDR (HEMOSTASIS) ×3 IMPLANT
KIT TURNOVER KIT B (KITS) ×3 IMPLANT
NS IRRIG 1000ML POUR BTL (IV SOLUTION) ×3 IMPLANT
PACK LAPAROSCOPY BASIN (CUSTOM PROCEDURE TRAY) ×3 IMPLANT
PACK TRENDGUARD 450 HYBRID PRO (MISCELLANEOUS) ×1 IMPLANT
POUCH SPECIMEN RETRIEVAL 10MM (ENDOMECHANICALS) ×6 IMPLANT
PROTECTOR NERVE ULNAR (MISCELLANEOUS) ×6 IMPLANT
SET IRRIG TUBING LAPAROSCOPIC (IRRIGATION / IRRIGATOR) ×3 IMPLANT
SET TUBE SMOKE EVAC HIGH FLOW (TUBING) ×3 IMPLANT
SHEARS HARMONIC ACE PLUS 36CM (ENDOMECHANICALS) ×3 IMPLANT
SLEEVE ENDOPATH XCEL 5M (ENDOMECHANICALS) ×3 IMPLANT
SUT MNCRL AB 4-0 PS2 18 (SUTURE) ×3 IMPLANT
SUT VICRYL 0 UR6 27IN ABS (SUTURE) ×3 IMPLANT
TOWEL GREEN STERILE FF (TOWEL DISPOSABLE) ×6 IMPLANT
TRAY FOLEY W/BAG SLVR 14FR (SET/KITS/TRAYS/PACK) ×3 IMPLANT
TRENDGUARD 450 HYBRID PRO PACK (MISCELLANEOUS) ×3
TROCAR BALLN 12MMX100 BLUNT (TROCAR) ×3 IMPLANT
TROCAR XCEL NON-BLD 11X100MML (ENDOMECHANICALS) ×3 IMPLANT
TROCAR XCEL NON-BLD 5MMX100MML (ENDOMECHANICALS) ×6 IMPLANT
WARMER LAPAROSCOPE (MISCELLANEOUS) ×3 IMPLANT

## 2020-02-06 NOTE — Anesthesia Procedure Notes (Signed)
Procedure Name: Intubation Date/Time: 02/06/2020 3:59 PM Performed by: Rica Records, CRNA Pre-anesthesia Checklist: Patient identified, Emergency Drugs available, Suction available, Patient being monitored and Timeout performed Patient Re-evaluated:Patient Re-evaluated prior to induction Oxygen Delivery Method: Circle system utilized Preoxygenation: Pre-oxygenation with 100% oxygen Induction Type: IV induction Ventilation: Mask ventilation without difficulty Laryngoscope Size: Miller and 2 Grade View: Grade I Tube type: Oral Tube size: 7.0 mm Number of attempts: 1 Airway Equipment and Method: Stylet Placement Confirmation: ETT inserted through vocal cords under direct vision Secured at: 22 cm Tube secured with: Tape Dental Injury: Teeth and Oropharynx as per pre-operative assessment

## 2020-02-06 NOTE — Transfer of Care (Signed)
Immediate Anesthesia Transfer of Care Note  Patient: Terri Carter  Procedure(s) Performed: LAPAROSCOPIC, LEFT oophorectomy (N/A )  Patient Location: PACU  Anesthesia Type:General  Level of Consciousness: awake, alert  and oriented  Airway & Oxygen Therapy: Patient Spontanous Breathing  Post-op Assessment: Report given to RN and Post -op Vital signs reviewed and stable  Post vital signs: Reviewed and stable  Last Vitals:  Vitals Value Taken Time  BP 146/96 02/06/20 1738  Temp    Pulse 104 02/06/20 1742  Resp 21 02/06/20 1742  SpO2 100 % 02/06/20 1742  Vitals shown include unvalidated device data.  Last Pain:  Vitals:   02/06/20 1314  TempSrc: Oral  PainSc:       Patients Stated Pain Goal: 4 (02/06/20 1025)  Complications: No complications documented.

## 2020-02-06 NOTE — Anesthesia Preprocedure Evaluation (Signed)
Anesthesia Evaluation  Patient identified by MRN, date of birth, ID band Patient awake    Reviewed: Allergy & Precautions, NPO status , Patient's Chart, lab work & pertinent test results  Airway Mallampati: II  TM Distance: >3 FB Neck ROM: Full    Dental  (+) Dental Advisory Given   Pulmonary former smoker,    breath sounds clear to auscultation       Cardiovascular negative cardio ROS   Rhythm:Regular Rate:Normal     Neuro/Psych  Headaches,    GI/Hepatic Neg liver ROS, GERD  ,  Endo/Other  negative endocrine ROS  Renal/GU negative Renal ROS     Musculoskeletal   Abdominal   Peds  Hematology negative hematology ROS (+)   Anesthesia Other Findings   Reproductive/Obstetrics                             Lab Results  Component Value Date   WBC 14.5 (H) 02/05/2020   HGB 12.2 02/05/2020   HCT 37.2 02/05/2020   MCV 94.7 02/05/2020   PLT 325 02/05/2020   Lab Results  Component Value Date   CREATININE 0.67 02/05/2020   BUN 9 02/05/2020   NA 139 02/05/2020   K 3.9 02/05/2020   CL 101 02/05/2020   CO2 26 02/05/2020    Anesthesia Physical Anesthesia Plan  ASA: II  Anesthesia Plan: General   Post-op Pain Management:    Induction: Intravenous  PONV Risk Score and Plan: 3 and Dexamethasone, Ondansetron and Treatment may vary due to age or medical condition  Airway Management Planned: Oral ETT  Additional Equipment: None  Intra-op Plan:   Post-operative Plan: Extubation in OR  Informed Consent: I have reviewed the patients History and Physical, chart, labs and discussed the procedure including the risks, benefits and alternatives for the proposed anesthesia with the patient or authorized representative who has indicated his/her understanding and acceptance.     Dental advisory given  Plan Discussed with: CRNA  Anesthesia Plan Comments:         Anesthesia Quick  Evaluation

## 2020-02-06 NOTE — Op Note (Signed)
Terri Carter PROCEDURE DATE: 02/06/2020  PREOPERATIVE DIAGNOSES: ovarian torsion POSTOPERATIVE DIAGNOSES: The same PROCEDURE: Laparoscopic left oophorectomy SURGEON:  Dr. Catalina Antigua ASSISTANT: Dr. Willodean Rosenthal   INDICATIONS: 39 y.o. with suspected ovarian torsion here today for definitive surgical management.   Risks of surgery were discussed with the patient including but not limited to: bleeding which may require transfusion or reoperation; infection which may require antibiotics; injury to bowel, bladder, ureters or other surrounding organs; need for additional procedures including laparotomy; thromboembolic phenomenon, incisional problems and other postoperative/anesthesia complications. Written informed consent was obtained.    FINDINGS:  Absent uterus and right adnexa, left ovary torsed 3 rotation around the infundibulopelvic ligament. Necrotic appearing ovary upon resolution of torsion. No evidence of endometriosis, adhesions or any other abdominal/pelvic abnormality.  Normal upper abdomen.  ANESTHESIA:    General INTRAVENOUS FLUIDS: 800 ml ESTIMATED BLOOD LOSS: 25 ml SPECIMENS:  left ovary  COMPLICATIONS: None immediate   PROCEDURE IN DETAIL:  The patient was taken to the operating room where general anesthesia was administered and was found to be adequate.  She was placed in the dorsal lithotomy position, and was prepped and draped in a sterile manner.  A Foley catheter was inserted into her bladder and attached to [name] drainage.  After an adequate timeout was performed, attention was then turned to the patient's abdomen where a 5-mm skin incision was made in the left upper quadrant and a 5 mm trocar was inserted under direct visualization.  A survey of the patient's pelvis and abdomen revealed the findings above.   A 5-mm lower quadrant ports  Was placed in the RLQ and a 11-mm trocar was placed in the LLQ under direct visualization.   On the left side, the  infundibulopelvic ligament was clamped and transected allowing for oophorectomy.  Excellent hemostasis was noted. The specimen was then removed from the abdomen through the 11-mm port using an Endocatch bag, under direct visualization.  The operative site was surveyed, and it was found to be hemostatic.  No intraoperative injury to other surrounding organs was noted.  The abdomen was desufflated and all instruments were then removed from the patient's abdomen. The fascial incision of the LLQ port was closed with a 0 Vicryl figure of eight stitch.  All skin incisions were closed with 3-0 Vicryl subcuticular stitches/Dermabond.   The patient will be discharged to home as per PACU criteria.  Routine postoperative instructions given.  She was prescribed Percocet, Ibuprofen and Colace.  She will follow up in the clinic in 2 weeks for postoperative evaluation.

## 2020-02-06 NOTE — Progress Notes (Signed)
Subjective: Patient reports feeling well this morning. She denies any severe pain but admits to severe pain overnight with associated nausea and emesis. She is found sitting up in bed comfortably.    Objective: I have reviewed patient's vital signs.  GENERAL: Well-developed, well-nourished female in no acute distress.  LUNGS: Clear to auscultation bilaterally.  HEART: Regular rate and rhythm. ABDOMEN: Soft, mild LLQ tenderness, nondistended.  PELVIC: Not performed EXTREMITIES: No cyanosis, clubbing, or edema, 2+ distal pulses.    Assessment/Plan: 39 yo with ultrasound finding suspicious for ovarian torsion - Discussed surgical management with diagnostic laparoscopy, possible oophorectomy, possible cystectomy, possible laparotomy. Risks, benefits and alternatives were discussed with the patient including but not limited to risks of bleeding, infection and damage to adjacent organs. Patient verbalized understanding and all questions were answered - patient also understands that she will enter menopause if oophorectomy is to be performed - Patient scheduled for 3 pm today   LOS: 0 days    Terri Carter 02/06/2020, 11:36 AM

## 2020-02-06 NOTE — Discharge Instructions (Signed)
Diagnostic Laparoscopy, Care After This sheet gives you information about how to care for yourself after your procedure. Your health care provider may also give you more specific instructions. If you have problems or questions, contact your health care provider. What can I expect after the procedure? After the procedure, it is common to have:  Mild discomfort in the abdomen.  Sore throat. Women who have laparoscopy with pelvic examination may have mild cramping and fluid coming from the vagina for a few days after the procedure. Follow these instructions at home: Medicines  Take over-the-counter and prescription medicines only as told by your health care provider.  If you were prescribed an antibiotic medicine, take it as told by your health care provider. Do not stop taking the antibiotic even if you start to feel better. Driving  Do not drive for 24 hours if you were given a medicine to help you relax (sedative) during your procedure.  Do not drive or use heavy machinery while taking prescription pain medicine. Bathing  Do not take baths, swim, or use a hot tub until your health care provider approves. You may take showers. Incision care   Follow instructions from your health care provider about how to take care of your incisions. Make sure you: ? Wash your hands with soap and water before you change your bandage (dressing). If soap and water are not available, use hand sanitizer. ? Change your dressing as told by your health care provider. ? Leave stitches (sutures), skin glue, or adhesive strips in place. These skin closures may need to stay in place for 2 weeks or longer. If adhesive strip edges start to loosen and curl up, you may trim the loose edges. Do not remove adhesive strips completely unless your health care provider tells you to do that.  Check your incision areas every day for signs of infection. Check for: ? Redness, swelling, or pain. ? Fluid or  blood. ? Warmth. ? Pus or a bad smell. Activity  Return to your normal activities as told by your health care provider. Ask your health care provider what activities are safe for you.  Do not lift anything that is heavier than 10 lb (4.5 kg), or the limit that you are told, until your health care provider says that it is safe. General instructions  To prevent or treat constipation while you are taking prescription pain medicine, your health care provider may recommend that you: ? Drink enough fluid to keep your urine pale yellow. ? Take over-the-counter or prescription medicines. ? Eat foods that are high in fiber, such as fresh fruits and vegetables, whole grains, and beans. ? Limit foods that are high in fat and processed sugars, such as fried and sweet foods.  Do not use any products that contain nicotine or tobacco, such as cigarettes and e-cigarettes. If you need help quitting, ask your health care provider.  Keep all follow-up visits as told by your health care provider. This is important. Contact a health care provider if:  You develop shoulder pain.  You feel lightheaded or faint.  You are unable to pass gas or have a bowel movement.  You feel nauseous or you vomit.  You develop a rash.  You have redness, swelling, or pain around any incision.  You have fluid or blood coming from any incision.  Any incision feels warm to the touch.  You have pus or a bad smell coming from any incision.  You have a fever or chills. Get help   right away if:  You have severe pain.  You have vomiting that does not go away.  You have heavy bleeding from the vagina.  Any incision opens.  You have trouble breathing.  You have chest pain. Summary  After the procedure, it is common to have mild discomfort in the abdomen and a sore throat.  Check your incision areas every day for signs of infection.  Return to your normal activities as told by your health care provider. Ask  your health care provider what activities are safe for you. This information is not intended to replace advice given to you by your health care provider. Make sure you discuss any questions you have with your health care provider. Document Revised: 01/26/2017 Document Reviewed: 08/09/2016 Elsevier Patient Education  2020 [employer]  

## 2020-02-06 NOTE — Progress Notes (Signed)
Initial Nutrition Assessment  DOCUMENTATION CODES:   Not applicable  INTERVENTION:   -Continue Boost Breeze po TID, each supplement provides 250 kcal and 9 grams of protein -MVI with minerals daily -RD will follow for diet advancement and adjust supplement regimen as appropriate  NUTRITION DIAGNOSIS:   Increased nutrient needs related to post-op healing as evidenced by estimated needs.  GOAL:   Patient will meet greater than or equal to 90% of their needs  MONITOR:   PO intake,Supplement acceptance,Diet advancement,Labs,Weight trends,Skin,I & O's  REASON FOR ASSESSMENT:   Malnutrition Screening Tool    ASSESSMENT:   Patient with h/o hysterectomy and RSO in 06/2019 following ovarian torsion. Seen in hospital with findings of no torsion, and taken to the OR where torsion was discovered about a week later and ovary could not be saved.  Pt admitted with cyst of lt ovary.   Reviewed I/O's: +1.8 L x 24 hours  Pt out of room at time of visit. No family members available at time of visit. Unable to obtain further nutrition-related history of complete nutrition-focused physical exam at this time.   Per MD notes, pt complained of nausea and emesis overnight. Pt currently on a clear liquid diet; noted meal completion 0%. Boost Breeze supplements already ordered.   Per OB-GYN notes, plan for diagnostic laparoscopy, possible oophorectomy, possible cystectomy, possible laparotomy today.   Reviewed wt hx; not weight loss over the past 6 months.   Medications reviewed and include vitamin B-12 and dextrose 5% in lactated ringers solution @ 125 ml/hr.   Labs reviewed.   Diet Order:   Diet Order            Diet NPO time specified Except for: Sips with Meds  Diet effective midnight           Diet clear liquid Room service appropriate? Yes; Fluid consistency: Thin  Diet effective now                 EDUCATION NEEDS:   No education needs have been identified at this  time  Skin:  Skin Assessment: Reviewed RN Assessment  Last BM:  02/03/20  Height:   Ht Readings from Last 1 Encounters:  02/05/20 5\' 7"  (1.702 m)    Weight:   Wt Readings from Last 1 Encounters:  02/05/20 77.1 kg    Ideal Body Weight:  61.4 kg  BMI:  Body mass index is 26.63 kg/m.  Estimated Nutritional Needs:   Kcal:  1950-2150  Protein:  95-100 grams  Fluid:  > 1.9 L    14/09/21, RD, LDN, CDCES Registered Dietitian II Certified Diabetes Care and Education Specialist Please refer to [facility] for RD and/or RD on-call/weekend/after hours pager

## 2020-02-09 ENCOUNTER — Encounter (HOSPITAL_COMMUNITY): Payer: Self-pay | Admitting: Obstetrics and Gynecology

## 2020-02-09 NOTE — Anesthesia Postprocedure Evaluation (Signed)
Anesthesia Post Note  Patient: Terri Carter  Procedure(s) Performed: LAPAROSCOPIC, LEFT oophorectomy (N/A )     Patient location during evaluation: PACU Anesthesia Type: General Level of consciousness: awake and alert Pain management: pain level controlled Vital Signs Assessment: post-procedure vital signs reviewed and stable Respiratory status: spontaneous breathing, nonlabored ventilation and respiratory function stable Cardiovascular status: blood pressure returned to baseline and stable Postop Assessment: no apparent nausea or vomiting Anesthetic complications: no   No complications documented.  Last Vitals:  Vitals:   02/06/20 1806 02/06/20 1820  BP: 109/78 115/89  Pulse: 95 84  Resp: 19 15  Temp:  36.9 C  SpO2: 100% 99%    Last Pain:  Vitals:   02/06/20 1820  TempSrc:   PainSc: 4                  Lucretia Kern

## 2020-02-09 NOTE — Discharge Summary (Signed)
Physician Discharge Summary  Patient ID: ENNA WARWICK MRN: [id number] DOB/AGE: [birth year] 39 y.o.  Admit date: 02/05/2020 Discharge date: 02/09/2020  Admission Diagnoses: suspected ovarian torsion  Discharge Diagnoses:  Principal Problem:   Cyst of left ovary Active Problems:   Abdominal pain   Ovarian torsion   Discharged Condition: good  Hospital Course: Patient with history of ovarian torsion, admitted with suspected ovarian torsion on left side. Patient underwent a diagnostic laparoscopy which demonstrated a left ovarian torsion. An oophorectomy was then performed without any complications. Patient was discharge home from the PACU with discharge instructions   Discharge Exam: Blood pressure 115/89, pulse 84, temperature 98.4 F (36.9 C), resp. rate 15, height 5' 7.01" (1.702 m), weight 77.1 kg, last menstrual period 05/19/2014, SpO2 99 %. See progress note  Disposition: Discharge disposition: 01-Home or Self Care        Allergies as of 02/06/2020      Reactions   Buspirone Other (See Comments)   Desvenlafaxine Other (See Comments)   Clindamycin/lincomycin Other (See Comments)   Vaginal infection   Latex Other (See Comments)   Pulls skin off when she wears it    Tape       Medication List    TAKE these medications   acetaminophen 500 MG tablet Commonly known as: TYLENOL Take 1,000 mg by mouth every 6 (six) hours as needed for mild pain.   buPROPion 300 MG 24 hr tablet Commonly known as: WELLBUTRIN XL Take 300 mg by mouth at bedtime.   clonazePAM 0.5 MG tablet Commonly known as: KLONOPIN Take 0.5 mg by mouth daily as needed for sleep.   cyanocobalamin 1000 MCG tablet Take 1,000 mcg by mouth daily.   docusate sodium 100 MG capsule Commonly known as: COLACE Take 1 capsule (100 mg total) by mouth 2 (two) times daily as needed.   escitalopram 20 MG tablet Commonly known as: LEXAPRO Take 20 mg by mouth daily.   hydrochlorothiazide 12.5 MG  capsule Commonly known as: MICROZIDE Take 12.5 mg by mouth at bedtime.   HYDROmorphone 4 MG tablet Commonly known as: Dilaudid Take 1 tablet (4 mg total) by mouth every 4 (four) hours as needed for severe pain.   hydrOXYzine 10 MG tablet Commonly known as: ATARAX/VISTARIL Take 10 mg by mouth as needed for anxiety.   ibuprofen 200 MG tablet Commonly known as: ADVIL Take 400-800 mg by mouth every 6 (six) hours as needed for moderate pain. What changed: Another medication with the same name was added. Make sure you understand how and when to take each.   ibuprofen 600 MG tablet Commonly known as: ADVIL Take 1 tablet (600 mg total) by mouth every 6 (six) hours as needed. What changed: You were already taking a medication with the same name, and this prescription was added. Make sure you understand how and when to take each.   ondansetron 4 MG disintegrating tablet Commonly known as: Zofran ODT Take 1 tablet (4 mg total) by mouth every 8 (eight) hours as needed for nausea or vomiting.   oxyCODONE-acetaminophen 5-325 MG tablet Commonly known as: PERCOCET/ROXICET Take 1 tablet by mouth every 6 (six) hours as needed.   oxyCODONE-acetaminophen 5-325 MG tablet Commonly known as: PERCOCET/ROXICET Take 1 tablet by mouth every 6 (six) hours as needed.   Vitamin D (Ergocalciferol) 1.25 MG (50000 UNIT) Caps capsule Commonly known as: DRISDOL Take 50,000 Units by mouth once a week.       Follow-up Information    [facility].   Why: An appointment will be scheduled for you to follow up in 2-3 weeks Contact information: [address] [place] [address] [phone]              Signed: Catalina Antigua 02/09/2020, 8:23 AM

## 2020-02-10 LAB — SURGICAL PATHOLOGY

## 2020-03-01 ENCOUNTER — Ambulatory Visit (INDEPENDENT_AMBULATORY_CARE_PROVIDER_SITE_OTHER): Payer: 59 | Admitting: Obstetrics and Gynecology

## 2020-03-01 ENCOUNTER — Other Ambulatory Visit: Payer: Self-pay

## 2020-03-01 ENCOUNTER — Encounter: Payer: Self-pay | Admitting: Obstetrics and Gynecology

## 2020-03-01 VITALS — BP 130/88 | HR 94 | Ht 67.0 in | Wt 168.0 lb

## 2020-03-01 DIAGNOSIS — Z9889 Other specified postprocedural states: Secondary | ICD-10-CM

## 2020-03-01 NOTE — Progress Notes (Signed)
40 yo here for post op check s/p a laparoscopic left oophorectomy on 02/06/20 due to ovarian torsion. Patient reports doing since her surgery and reports some occasional sharp left sided incisional pain. Patient reports some vasomotor symptoms and insomnia. Patient also admits to the discontinuation of her lexapro, Wellbutrin and klonopin since her surgery due to constipation. Patient is scheduled to see her PCP later today to discuss restarting her medication. Patient is also interested in smoking cessation.  Past Medical History:  Diagnosis Date  . Anxiety   . GERD (gastroesophageal reflux disease)   . Headache    cluster  . Voice hoarseness last several months   Past Surgical History:  Procedure Laterality Date  . ABDOMINAL HYSTERECTOMY    . BRAVO PH STUDY N/A 06/23/2014   Procedure: BRAVO PH STUDY;  Surgeon: Charlott Rakes, MD;  Location: [facility];  Service: Endoscopy;  Laterality: N/A;  . 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 ESOPHAGOGASTRODUODENOSCOPY (EGD) WITH PROPOFOL N/A 06/23/2014   Procedure: ESOPHAGOGASTRODUODENOSCOPY (EGD) WITH PROPOFOL;  Surgeon: Charlott Rakes, MD;  Location: [facility];  Service: Endoscopy;  Laterality: N/A;  . LAPAROSCOPY N/A 02/06/2020   Procedure: LAPAROSCOPIC, LEFT oophorectomy;  Surgeon: Catalina Antigua, MD;  Location: [facility] OR;  Service: Gynecology;  Laterality: N/A;  . wistom teeth extraction  8 years ago  . XI ROBOTIC ASSISTED TOTAL HYSTERECTOMY WITH SALPINGECTOMY  03/20/2019   Done at [facility] for high grade cervical dysplasia. Pathology showed no residual dysplasia (had CKC prior to hysterectomy)   No family history on file. Social History   Tobacco Use  . Smoking status: Former Smoker    Types: Cigars  . Smokeless tobacco: Never Used  . Tobacco comment: ciagarettes quit 2010 smoked one half to 1 ppd for 8 years now amokes cigars   Vaping Use   . Vaping Use: Some days  . Substances: Mixture of cannabinoids  Substance Use Topics  . Alcohol use: Not Currently    Comment: occasional wine  . Drug use: Not Currently    Types: Marijuana    Comment: occasional marijuana use   ROS See pertinent in HPI. All other systems reviewed and negative  Blood pressure 130/88, pulse 94, height 5\' 7"  (1.702 m), weight 168 lb (76.2 kg), last menstrual period 05/19/2014.  GENERAL: Well-developed, well-nourished female in no acute distress.  LUNGS: Clear to auscultation bilaterally.  HEART: Regular rate and rhythm. ABDOMEN: Soft, nontender, nondistended. No organomegaly. Incisions: healing well x 3 without erythema, induration or drainage EXTREMITIES: No cyanosis, clubbing, or edema, 2+ distal pulses.  A/p 40 yo s/p left oophorectomy here for post op check - Patient is medically cleared to resume all activities of daily living - Discussed Chantix to assist with smoking cessation and patient plans to see PCP as it was previously prescribed to her - Discussed re-evaluation of vasomotor symptoms upon restarting lexapro and wellbutrin - Patient is due for a mammogram and is aware of it. She plans to schedule it through her PCP sometime this year - RTC prn

## 2020-03-01 NOTE — Progress Notes (Signed)
GYN presents for PostOp FU.  C/o site pain 3/10

## 2022-03-28 IMAGING — MR MR PELVIS WO/W CM
8 of 12 series · 16 of 40 positions shown · IV contrast (YES GAD)
Comparison: Pelvic ultrasound, 02/05/2020, 07/17/2019

CLINICAL DATA: Evaluate for ovarian torsion, status post right
oophorectomy

EXAM:
MRI PELVIS WITHOUT AND WITH CONTRAST
TECHNIQUE: Multiplanar multisequence MR imaging of the pelvis was performed
both before and after administration of intravenous contrast.
CONTRAST:  7.7mL GADAVIST GADOBUTROL 1 MMOL/ML IV SOLN

[Series 2: T2 · coronal · 5.0mm · 0.59mm/px · 2 of 36 slices shown (1 of 3)]
[im 1/36]
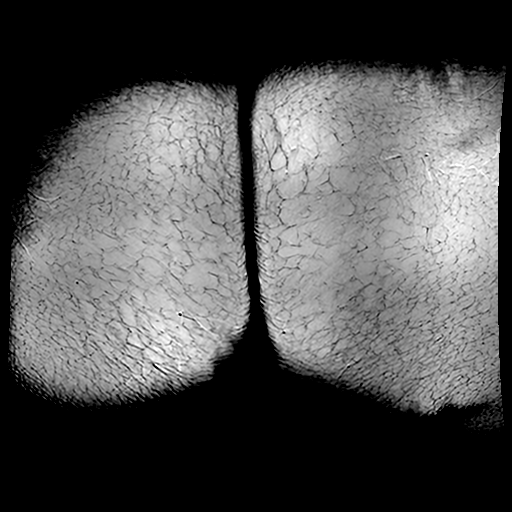
[im 36/36]
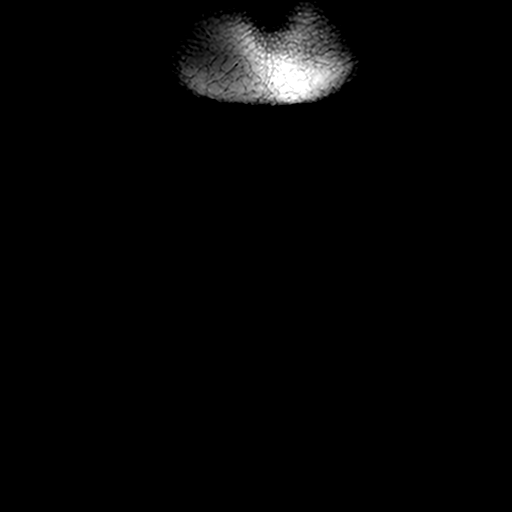

[Series 3: T2 · sagittal · 5.0mm · 0.51mm/px · 2 of 42 slices shown (2 of 3)]
[im 1/42]
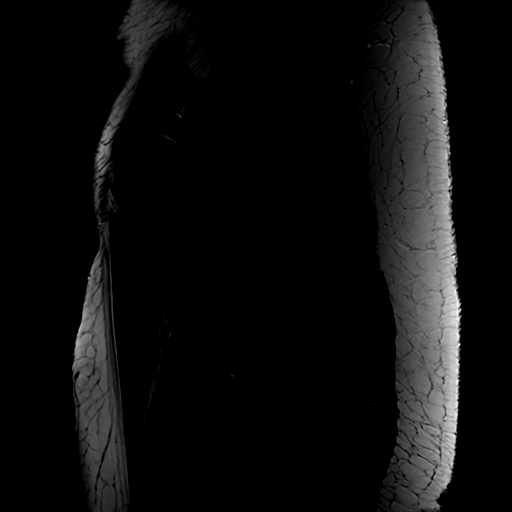
[im 42/42]
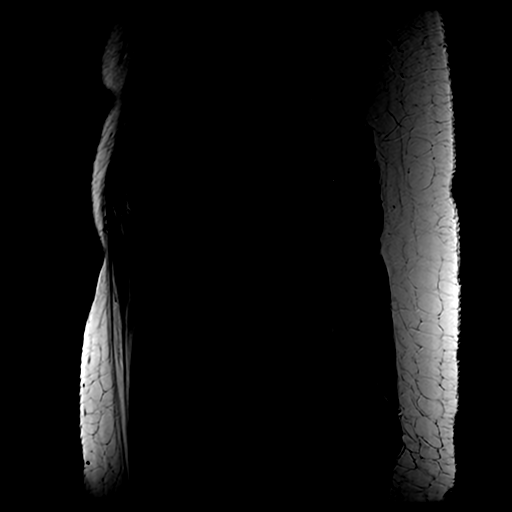

[Series 4: T2 · axial · 5.0mm · 0.51mm/px · z∈[+4,+250]mm · 2 of 42 slices shown (3 of 3)]
[im 1/42]
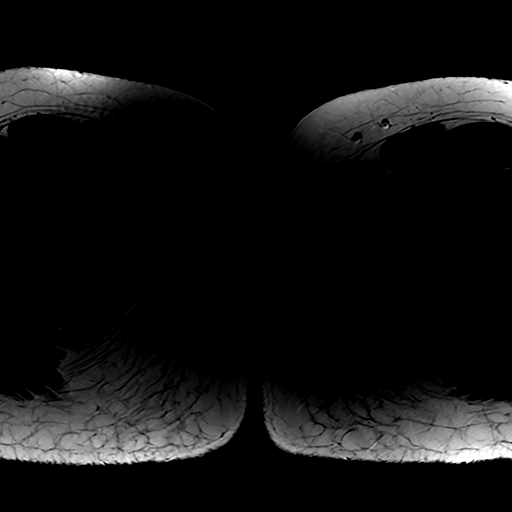
[im 42/42]
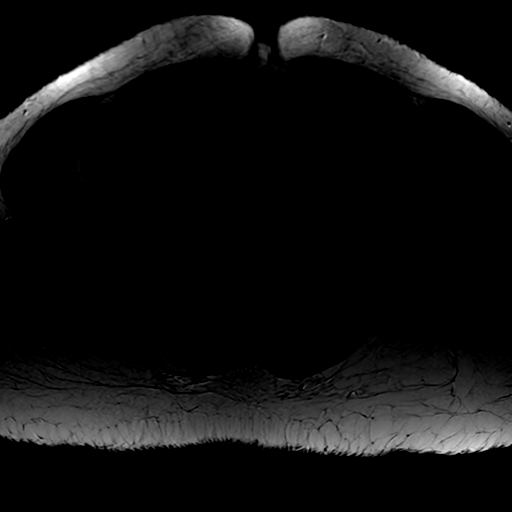

[Series 5: T2 fat-sat · axial · 5.0mm · 0.51mm/px · z∈[+4,+250]mm · 2 of 42 slices shown]
[im 1/42]
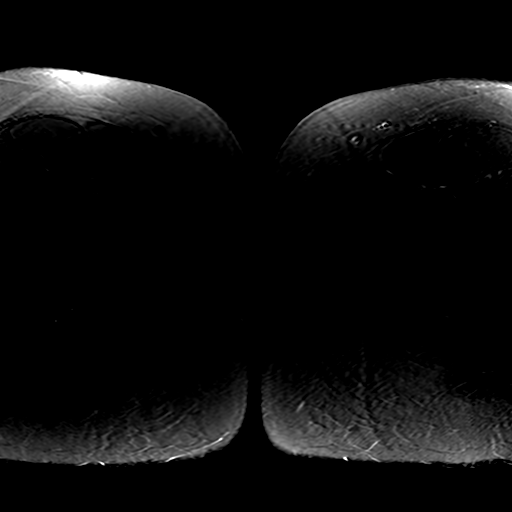
[im 42/42]
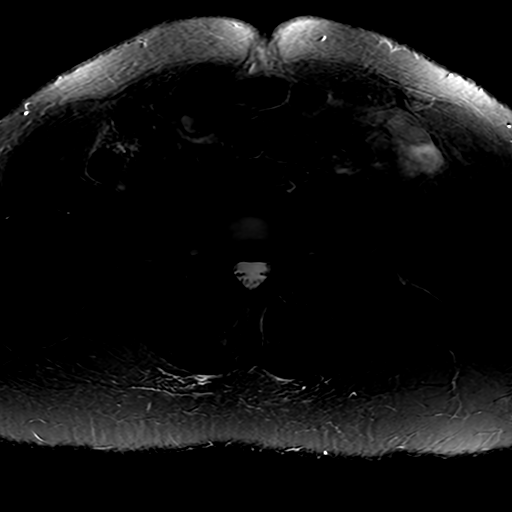

[Series 6: T1 fat-sat · axial · 5.0mm · 0.51mm/px · z∈[+4,+250]mm · 2 of 42 slices shown (1 of 2)]
[im 1/42]
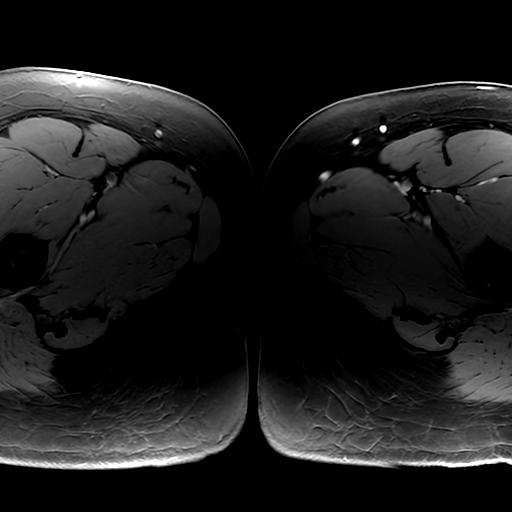
[im 42/42]
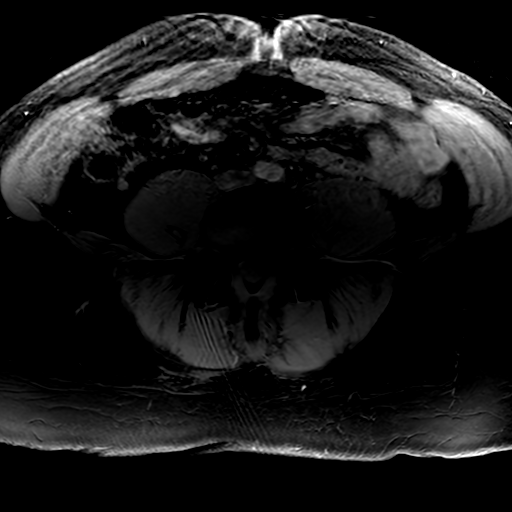

[Series 8: T1 fat-sat post-contrast · axial · 5.0mm · 0.51mm/px · z∈[+4,+250]mm · 2 of 42 slices shown]
[im 1/42]
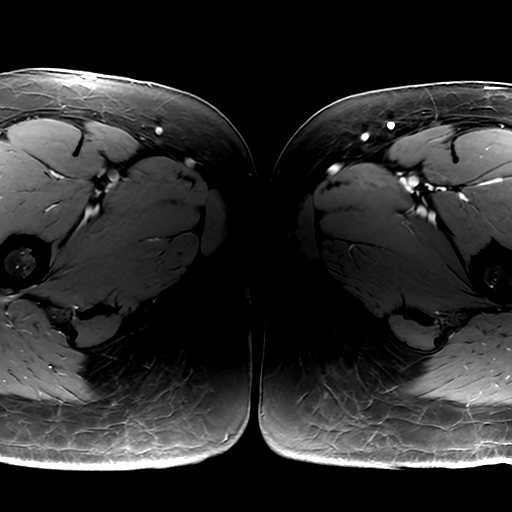
[im 42/42]
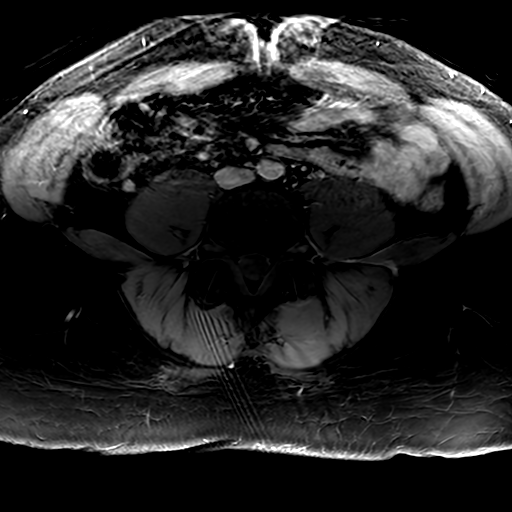

[Series 9: T1 fat-sat · coronal · 5.0mm · 0.59mm/px · 2 of 36 slices shown (2 of 2)]
[im 1/36]
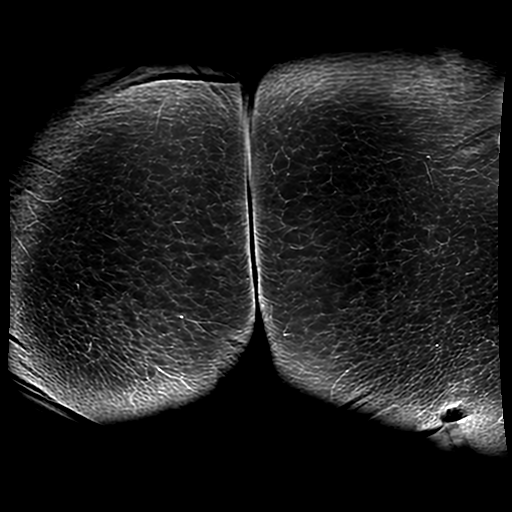
[im 36/36]
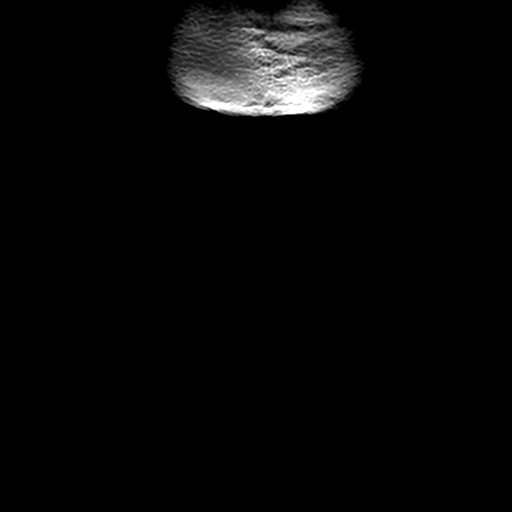

[Series 10: T2 post-contrast · sagittal · 5.0mm · 0.51mm/px · 2 of 42 slices shown]
[im 1/42]
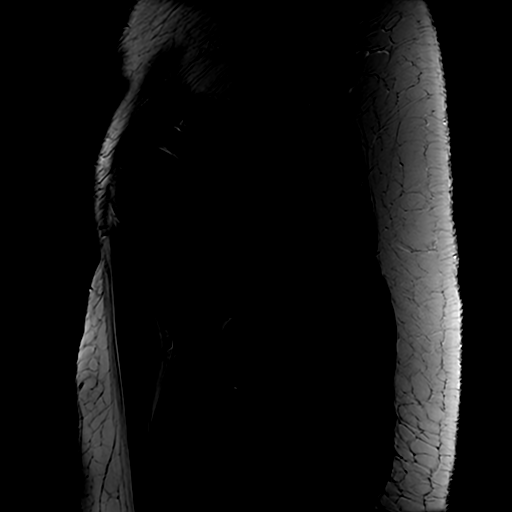
[im 42/42]
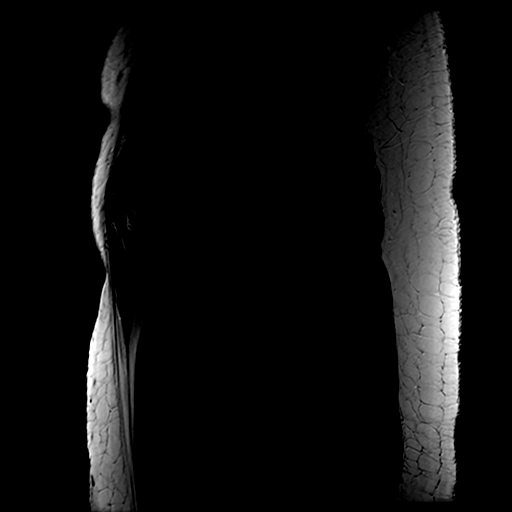

[16 of 40 positions shown; findings below may reference images not displayed]

FINDINGS: Urinary Tract:  No abnormality visualized.

Bowel: Unremarkable visualized pelvic bowel loops. Normal appendix.

Vascular/Lymphatic: No pathologically enlarged lymph nodes. No
significant vascular abnormality seen.

Reproductive: Status post hysterectomy and right oophorectomy. The
left ovary is grossly enlarged measuring 6.7 x 4.8 x 4.0 cm,
containing multiple small cysts and follicles, some of which
demonstrate hemorrhagic or proteinaceous contents (series 8, image
17). There is contrast enhancement following gadolinium
administration.

Other:  Moderate volume free fluid in the pelvis.

Musculoskeletal: No suspicious bone lesions identified.
IMPRESSION: 1. The left ovary is grossly enlarged measuring 6.7 cm, containing
multiple small cysts and follicles, in keeping with appearance on
previous day ultrasound. There is contrast enhancement following
gadolinium administration. The overall appearance is concerning for
intermittent or incomplete torsion particularly given that the ovary
has significantly enlarged in comparison to prior examination dated
07/17/2019 and given the presence of free fluid in the pelvis.
Doppler ultrasound is the most sensitive and specific imaging test
for the evaluation of suspected torsion.

2.  Status post hysterectomy and right oophorectomy.
